# Patient Record
Sex: Male | Born: 1975 | Race: White | Hispanic: No | Marital: Married | Smoking: Current every day smoker
Health system: Southern US, Community
[De-identification: ages and names within clinical notes are randomized; demographics above are authoritative.]

## PROBLEM LIST (undated history)

## (undated) DIAGNOSIS — I10 Essential (primary) hypertension: Secondary | ICD-10-CM

## (undated) DIAGNOSIS — G629 Polyneuropathy, unspecified: Secondary | ICD-10-CM

## (undated) DIAGNOSIS — G4730 Sleep apnea, unspecified: Secondary | ICD-10-CM

## (undated) DIAGNOSIS — K2960 Other gastritis without bleeding: Secondary | ICD-10-CM

## (undated) DIAGNOSIS — R569 Unspecified convulsions: Secondary | ICD-10-CM

## (undated) DIAGNOSIS — M109 Gout, unspecified: Secondary | ICD-10-CM

## (undated) DIAGNOSIS — G43909 Migraine, unspecified, not intractable, without status migrainosus: Secondary | ICD-10-CM

## (undated) DIAGNOSIS — I509 Heart failure, unspecified: Secondary | ICD-10-CM

## (undated) DIAGNOSIS — T39395A Adverse effect of other nonsteroidal anti-inflammatory drugs [NSAID], initial encounter: Secondary | ICD-10-CM

---
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
## 2015-09-23 DIAGNOSIS — K2960 Other gastritis without bleeding: Secondary | ICD-10-CM

## 2015-09-23 HISTORY — DX: Other gastritis without bleeding: K29.60

## 2018-09-25 ENCOUNTER — Encounter (HOSPITAL_COMMUNITY): Payer: Self-pay

## 2018-09-25 ENCOUNTER — Emergency Department (HOSPITAL_COMMUNITY): Payer: Self-pay

## 2018-09-25 ENCOUNTER — Emergency Department (HOSPITAL_COMMUNITY)
Admission: EM | Admit: 2018-09-25 | Discharge: 2018-09-25 | Disposition: A | Discharge status: Home / Self Care | Payer: Self-pay | Attending: Emergency Medicine | Admitting: Emergency Medicine

## 2018-09-25 DIAGNOSIS — M5441 Lumbago with sciatica, right side: Secondary | ICD-10-CM | POA: Insufficient documentation

## 2018-09-25 HISTORY — DX: Essential (primary) hypertension: I10

## 2018-09-25 HISTORY — DX: Gout, unspecified: M10.9

## 2018-09-25 MED ORDER — LIDOCAINE 5 % EX PTCH
1.0000 | MEDICATED_PATCH | CUTANEOUS | Status: DC
  Administered 2018-09-25: 1 via TRANSDERMAL
  Filled 2018-09-25: qty 1

## 2018-09-25 MED ORDER — METHOCARBAMOL 500 MG PO TABS: 500.0000 mg | ORAL_TABLET | Freq: Two times a day (BID) | ORAL | 0 refills | Status: DC

## 2018-09-25 MED ORDER — OXYCODONE-ACETAMINOPHEN 5-325 MG PO TABS
2.0000 | ORAL_TABLET | Freq: Once | ORAL | Status: AC
  Administered 2018-09-25: 2 via ORAL
  Filled 2018-09-25: qty 2

## 2018-09-25 MED ORDER — PREDNISONE 20 MG PO TABS: 60.0000 mg | ORAL_TABLET | Freq: Every day | ORAL | 0 refills | Status: AC

## 2018-09-25 MED ORDER — MORPHINE SULFATE (PF) 4 MG/ML IV SOLN
4.0000 mg | Freq: Once | INTRAVENOUS | Status: AC
  Administered 2018-09-25: 4 mg via INTRAMUSCULAR
  Filled 2018-09-25: qty 1

## 2018-09-25 MED ORDER — PREDNISONE 20 MG PO TABS
60.0000 mg | ORAL_TABLET | Freq: Once | ORAL | Status: AC
  Administered 2018-09-25: 60 mg via ORAL
  Filled 2018-09-25: qty 3

## 2018-09-25 MED ORDER — KETOROLAC TROMETHAMINE 30 MG/ML IJ SOLN
30.0000 mg | Freq: Once | INTRAMUSCULAR | Status: AC
  Administered 2018-09-25: 30 mg via INTRAMUSCULAR
  Filled 2018-09-25: qty 1

## 2018-09-25 MED ORDER — METHOCARBAMOL 500 MG PO TABS
1000.0000 mg | ORAL_TABLET | Freq: Once | ORAL | Status: AC
  Administered 2018-09-25: 1000 mg via ORAL
  Filled 2018-09-25: qty 2

## 2018-09-25 NOTE — ED Triage Notes (Signed)
Patient complains of lower back pain radiating down legs x 1 week. States that he has old injury and thinks flared up, denies recent trauma

## 2018-09-25 NOTE — Discharge Instructions (Signed)
You were seen here today for Back Pain: Low back pain is discomfort in the lower back that may be due to injuries to muscles and ligaments around the spine. Occasionally, it may be caused by a problem to a part of the spine called a disc. Your back pain should be treated with medicines listed below as well as back exercises and this back pain should get better over the next 2 weeks. Most patients get completely well in 4 weeks. It is important to know however, if you develop severe or worsening pain, low back pain with fever, numbness, weakness or inability to walk or urinate, you should return to the ER immediately.  Please follow up with your doctor this week for a recheck if still having symptoms.  HOME INSTRUCTIONS Self - care:  The application of heat can help soothe the pain.  Maintaining your daily activities, including walking (this is encouraged), as it will help you get better faster than just staying in bed. Do not life, push, pull anything more than 10 pounds for the next week. I am attaching back exercises that you can do at home to help facilitate your recovery.   Back Exercises - I have attached a handout on back exercises that can be done at home to help facilitate your recovery.   Medications are also useful to help with pain control.   Acetaminophen.  This medication is generally safe, and found over the counter. Take as directed for your age. You should not take more than 8 of the extra strength (500mg ) pills a day (max dose is 4000mg  total OVER one day)  Non steroidal anti inflammatory: This includes medications including Ibuprofen, naproxen and Mobic; These medications help both pain and swelling and are very useful in treating back pain.  They should be taken with food, as they can cause stomach upset, and more seriously, stomach bleeding. Do not combine the medications.   Muscle relaxants:  These medications can help with muscle tightness that is a cause of lower back pain.  Most  of these medications can cause drowsiness, and it is not safe to drive or use dangerous machinery while taking them. They are primarily helpful when taken at night before sleep.  Prednisone - This is an oral steroid.  This medication is best taken with food in the morning.  Please note that this medication can cause anxiety, mood swings, muscle fatigue, increased hunger, weight gain (sodium/fluid retention), poor sleep as well as other symptoms. If you are a diabetic, please monitor your blood sugars at home as this medication can increase your blood sugars. Call your pharmacist if you have any questions.  You will need to follow up with your primary healthcare provider  or the Orthopedist in 1-2 weeks for reassessment and persistent symptoms.  Be aware that if you develop new symptoms, such as a fever, leg weakness, difficulty with or loss of control of your urine or bowels, abdominal pain, or more severe pain, you will need to seek medical attention and/or return to the Emergency department. Additional Information:  Your vital signs today were: BP (!) 134/95 (BP Location: Left Arm)    Pulse 82    Temp 98.5 F (36.9 C) (Oral)    Resp 18    SpO2 96%  If your blood pressure (BP) was elevated above 135/85 this visit, please have this repeated by your doctor within one month. ---------------

## 2018-09-25 NOTE — ED Provider Notes (Signed)
[facility] Provider Note   CSN: [id number] Arrival date & time: 09/25/18  1234     History   Chief Complaint Chief Complaint  Patient presents with  . Back Pain    HPI Shawn Hines is a 43 y.o. male.  Shawn Mulderslfredo Shawn Hines is a 43 y.o. male with a history of hypertension, gout, neuropathy and chronic low back pain.  He reports ever since a back injury back in 2015 he is intermittently had issues with low back pain.  He reports over the last week he has had severe pain in his lower back which is primarily on the right side of the lower back, pain radiates into his right leg.  He denies any numbness or weakness in the leg.  But reports pain makes it extremely difficult to walk or move.  No loss of bowel or bladder control.  No fevers or chills.  No abdominal pain, nausea or vomiting.  No burning or discomfort with urination.  He has been taking his home oxycodone as well as naproxen for pain, and uses a TENS unit intermittently but reports he has not been able to get relief, he has an appointment with Dr. Mayford KnifeWilliams who manages his chronic low back pain in 3 days but was not able to make it to this appointment due to worsening of pain.     Past Medical History:  Diagnosis Date  . Gout   . Hypertension   . Neuropathy     There are no active problems to display for this patient.   History reviewed. No pertinent surgical history.      Home Medications    Prior to Admission medications   Not on File    Family History No family history on file.  Social History Social History   Tobacco Use  . Smoking status: Not on file  Substance Use Topics  . Alcohol use: Not on file  . Drug use: Not on file     Allergies   Patient has no known allergies.   Review of Systems Review of Systems  Constitutional: Negative for chills and fever.  HENT: Negative.   Respiratory: Negative for shortness of breath.   Cardiovascular: Negative for  chest pain.  Gastrointestinal: Negative for abdominal pain, constipation, diarrhea, nausea and vomiting.  Genitourinary: Negative for dysuria, flank pain, frequency and hematuria.  Musculoskeletal: Positive for back pain. Negative for arthralgias, gait problem, joint swelling, myalgias and neck pain.  Skin: Negative for color change, rash and wound.  Neurological: Negative for weakness and numbness.     Physical Exam Updated Vital Signs BP (!) 134/95 (BP Location: Left Arm)   Pulse 82   Temp 98.5 F (36.9 C) (Oral)   Resp 18   SpO2 96%   Physical Exam Vitals signs and nursing note reviewed.  Constitutional:      General: He is not in acute distress.    Appearance: He is well-developed. He is not diaphoretic.  HENT:     Head: Atraumatic.  Eyes:     General:        Right eye: No discharge.        Left eye: No discharge.  Neck:     Musculoskeletal: Neck supple.  Cardiovascular:     Pulses:          Radial pulses are 2+ on the right side and 2+ on the left side.       Dorsalis pedis pulses are 2+ on the right  side and 2+ on the left side.       Posterior tibial pulses are 2+ on the right side and 2+ on the left side.  Pulmonary:     Effort: Pulmonary effort is normal. No respiratory distress.  Abdominal:     General: Bowel sounds are normal. There is no distension.     Palpations: Abdomen is soft. There is no mass.     Tenderness: There is no abdominal tenderness. There is no guarding.     Comments: Abdomen soft, nondistended, nontender to palpation in all quadrants without guarding or peritoneal signs, no CVA tenderness bilaterally  Musculoskeletal:     Comments: Tenderness to palpation over right lower back.  Pain made worse with range of motion of the lower extremities, positive straight leg raise on the  right.  Skin:    General: Skin is warm and dry.     Capillary Refill: Capillary refill takes less than 2 seconds.  Neurological:     Mental Status: He is alert and  oriented to person, place, and time. Mental status is at baseline.     Comments: Alert, clear speech, following commands. Moving all extremities without difficulty. Bilateral lower extremities with 5/5 strength in proximal and distal muscle groups and with dorsi and plantar flexion. Sensation intact in bilateral lower extremities. 2+ patellar DTRs bilaterally. Ambulatory with steady gait  Psychiatric:        Mood and Affect: Mood normal.        Behavior: Behavior normal.      ED Treatments / Results  Labs (all labs ordered are listed, but only abnormal results are displayed) Labs Reviewed - No data to display  EKG None  Radiology Dg Lumbar Spine Complete  Result Date: 09/25/2018 CLINICAL DATA:  Patient complains of lower back pain radiating down legs x 1 week. States that he has old injury and thinks flared up, denies recent trauma. Pt writhing and hollering in pain. EXAM: LUMBAR SPINE - COMPLETE 4+ VIEW COMPARISON:  None. FINDINGS: No fracture or bone lesion. Slight anterolisthesis of L4 on L5. No other spondylolisthesis. There is straightening of the normal lumbar lordosis. Mild loss of disc height at L4-L5. Remaining discs are well preserved in height. Facet joints are well preserved. Soft tissues are unremarkable. IMPRESSION: 1. No fracture or acute finding. 2. Straightened lumbar lordosis. Mild disc degenerative changes at L4-L5 with a slight anterolisthesis. Electronically Signed   By: Amie Portland M.D.   On: 09/25/2018 15:46    Procedures Procedures (including critical care time)  Medications Ordered in ED Medications  lidocaine (LIDODERM) 5 % 1 patch (1 patch Transdermal Patch Applied 09/25/18 1543)  ketorolac (TORADOL) 30 MG/ML injection 30 mg (30 mg Intramuscular Given 09/25/18 1541)  predniSONE (DELTASONE) tablet 60 mg (60 mg Oral Given 09/25/18 1542)  oxyCODONE-acetaminophen (PERCOCET/ROXICET) 5-325 MG per tablet 2 tablet (2 tablets Oral Given 09/25/18 1542)  methocarbamol  (ROBAXIN) tablet 1,000 mg (1,000 mg Oral Given 09/25/18 1542)  morphine 4 MG/ML injection 4 mg (4 mg Intramuscular Given 09/25/18 1747)     Initial Impression / Assessment and Plan / ED Course  I have reviewed the triage vital signs and the nursing notes.  Pertinent labs & imaging results that were available during my care of the patient were reviewed by me and considered in my medical decision making (see chart for details).  Patient with right lower back pain, history of chronic back pain ever since injury in 2015.  No neurological deficits and normal  neuro exam.  Patient can walk but states is painful.  No loss of bowel or bladder control.  No concern for cauda equina.  No fever, night sweats, weight loss, h/o cancer, IVDU.  X-ray shows no evidence of acute fracture or change, native disc changes at L4-L5.  Pain improved with treatment here in the emergency department.  Patient has follow-up appointment in 3 days with his primary care doctor that has been managing his chronic back pain.  Will discharge home with course of steroids and muscle relaxers in addition to patient's home pain medication.  Return precautions discussed.  Patient expresses understanding and is in agreement with plan.  Final Clinical Impressions(s) / ED Diagnoses   Final diagnoses:  Acute right-sided low back pain with right-sided sciatica    ED Discharge Orders         Ordered    predniSONE (DELTASONE) 20 MG tablet  Daily     09/25/18 1733    methocarbamol (ROBAXIN) 500 MG tablet  2 times daily     09/25/18 1733           Dartha Lodge, [place] 09/25/18 1800    Tilden Fossa, MD 09/27/18 [phone]

## 2018-10-03 ENCOUNTER — Encounter (HOSPITAL_COMMUNITY): Payer: Self-pay

## 2018-10-03 ENCOUNTER — Emergency Department (HOSPITAL_COMMUNITY): Payer: Self-pay

## 2018-10-03 ENCOUNTER — Emergency Department (HOSPITAL_COMMUNITY)
Admission: EM | Admit: 2018-10-03 | Discharge: 2018-10-03 | Disposition: A | Discharge status: Home / Self Care | Payer: Self-pay | Attending: Emergency Medicine | Admitting: Emergency Medicine

## 2018-10-03 DIAGNOSIS — Y939 Activity, unspecified: Secondary | ICD-10-CM | POA: Insufficient documentation

## 2018-10-03 DIAGNOSIS — F17200 Nicotine dependence, unspecified, uncomplicated: Secondary | ICD-10-CM | POA: Insufficient documentation

## 2018-10-03 DIAGNOSIS — M5441 Lumbago with sciatica, right side: Secondary | ICD-10-CM | POA: Insufficient documentation

## 2018-10-03 DIAGNOSIS — Y929 Unspecified place or not applicable: Secondary | ICD-10-CM | POA: Insufficient documentation

## 2018-10-03 DIAGNOSIS — Y33XXXA Other specified events, undetermined intent, initial encounter: Secondary | ICD-10-CM | POA: Insufficient documentation

## 2018-10-03 DIAGNOSIS — S3421XA Injury of nerve root of lumbar spine, initial encounter: Secondary | ICD-10-CM | POA: Insufficient documentation

## 2018-10-03 DIAGNOSIS — M21371 Foot drop, right foot: Secondary | ICD-10-CM | POA: Insufficient documentation

## 2018-10-03 DIAGNOSIS — Y999 Unspecified external cause status: Secondary | ICD-10-CM | POA: Insufficient documentation

## 2018-10-03 DIAGNOSIS — I10 Essential (primary) hypertension: Secondary | ICD-10-CM | POA: Insufficient documentation

## 2018-10-03 LAB — CBC
HCT: 44 % (ref 39.0–52.0)
Hemoglobin: 15 g/dL (ref 13.0–17.0)
MCH: 30.2 pg (ref 26.0–34.0)
MCHC: 34.1 g/dL (ref 30.0–36.0)
MCV: 88.7 fL (ref 80.0–100.0)
Platelets: 344 10*3/uL (ref 150–400)
RBC: 4.96 MIL/uL (ref 4.22–5.81)
RDW: 13.2 % (ref 11.5–15.5)
WBC: 10.7 10*3/uL — ABNORMAL HIGH (ref 4.0–10.5)
nRBC: 0 % (ref 0.0–0.2)

## 2018-10-03 LAB — COMPREHENSIVE METABOLIC PANEL
ALT: 41 U/L (ref 0–44)
AST: 28 U/L (ref 15–41)
Albumin: 4 g/dL (ref 3.5–5.0)
Alkaline Phosphatase: 62 U/L (ref 38–126)
Anion gap: 8 (ref 5–15)
BILIRUBIN TOTAL: 0.6 mg/dL (ref 0.3–1.2)
BUN: 14 mg/dL (ref 6–20)
CO2: 24 mmol/L (ref 22–32)
Calcium: 9 mg/dL (ref 8.9–10.3)
Chloride: 106 mmol/L (ref 98–111)
Creatinine, Ser: 1.02 mg/dL (ref 0.61–1.24)
GFR calc Af Amer: >60 mL/min (ref >60)
GFR calc non Af Amer: >60 mL/min (ref >60)
Glucose, Bld: 101 mg/dL — ABNORMAL HIGH (ref 70–99)
Potassium: 3.6 mmol/L (ref 3.5–5.1)
Sodium: 138 mmol/L (ref 135–145)
Total Protein: 6.6 g/dL (ref 6.5–8.1)

## 2018-10-03 MED ORDER — PREDNISONE 10 MG PO TABS: ORAL_TABLET | ORAL | 0 refills | Status: DC

## 2018-10-03 MED ORDER — HYDROMORPHONE HCL 1 MG/ML IJ SOLN
1.0000 mg | Freq: Once | INTRAMUSCULAR | Status: AC
  Administered 2018-10-03: 1 mg via INTRAVENOUS
  Filled 2018-10-03: qty 1

## 2018-10-03 MED ORDER — DIVALPROEX SODIUM 250 MG PO DR TAB
500.0000 mg | DELAYED_RELEASE_TABLET | Freq: Once | ORAL | Status: AC
  Administered 2018-10-03: 500 mg via ORAL
  Filled 2018-10-03: qty 2

## 2018-10-03 NOTE — Discharge Instructions (Signed)
You were evaluated in the Emergency Department and after careful evaluation, we did not find any emergent condition requiring admission or further testing in the hospital.  Your symptoms today seem to be due to a herniated disc causing compression of a nerve.  This will likely require surgery.  Please call the neurosurgeons office first thing in the morning.  You will likely be able to be seen tomorrow morning at 9 AM.  Take the steroid medication as directed.  Please return to the Emergency Department if you experience any worsening of your condition.  We encourage you to follow up with a primary care provider.  Thank you for allowing [place] to be a part of your care.

## 2018-10-03 NOTE — ED Notes (Signed)
Pt transported to MRI 

## 2018-10-03 NOTE — ED Provider Notes (Signed)
[facility] Provider Note MRN:  [id number]  Arrival date & time: 10/03/18     Chief Complaint   Back Pain   History of Present Illness   Shawn Hines is a 43 y.o. year-old male with a history of chronic back pain presenting to the ED with chief complaint of back pain.  Patient explains that he has had chronic pain in the lower back related to ruptured disks that occurred back in 2015, has had multiple MRIs with the last one being in 2016.  For the past 2 weeks, he has been having increased pain to the midline and right-sided lumbar back.  Woke up with increased pain, has been progressively worsening.  Was seen in this emergency department a few days ago, given prednisone prescription, no improvement.  Pain is now 10 out of 10, constant, worse with motion or palpation.  Patient endorsing new numbness to the right leg up to the level of the knee as well as weakness to the left leg, noticing that he has to swing the leg out in order to walk.  Denies bowel or bladder dysfunction.  Denies fever, no IV drug use, no hematuria or dysuria, no chest pain or shortness of breath, no abdominal pain.  Review of Systems  A complete 10 system review of systems was obtained and all systems are negative except as noted in the HPI and PMH.   Patient's Health History    Past Medical History:  Diagnosis Date  . Gout   . Hypertension   . Neuropathy     No past surgical history on file.  No family history on file.  Social History   Socioeconomic History  . Marital status: Single    Spouse name: Not on file  . Number of children: Not on file  . Years of education: Not on file  . Highest education level: Not on file  Occupational History  . Not on file  Social Needs  . Financial resource strain: Not on file  . Food insecurity:    Worry: Not on file    Inability: Not on file  . Transportation needs:    Medical: Not on file    Non-medical: Not on file    Tobacco Use  . Smoking status: Current Every Day Smoker  . Smokeless tobacco: Never Used  Substance and Sexual Activity  . Alcohol use: Never    Frequency: Never  . Drug use: Never  . Sexual activity: Not on file  Lifestyle  . Physical activity:    Days per week: Not on file    Minutes per session: Not on file  . Stress: Not on file  Relationships  . Social connections:    Talks on phone: Not on file    Gets together: Not on file    Attends religious service: Not on file    Active member of club or organization: Not on file    Attends meetings of clubs or organizations: Not on file    Relationship status: Not on file  . Intimate partner violence:    Fear of current or ex partner: Not on file    Emotionally abused: Not on file    Physically abused: Not on file    Forced sexual activity: Not on file  Other Topics Concern  . Not on file  Social History Narrative  . Not on file     Physical Exam  Vital Signs and Nursing Notes reviewed Vitals:   10/03/18  1601 10/03/18 1717  BP: (!) 152/96 (!) 150/91  Pulse: 90 84  Resp: 18   Temp:    SpO2: 98% 96%    CONSTITUTIONAL: Well-appearing, NAD NEURO:  Alert and oriented x 3, decreased sensation to the right leg, absent right patellar reflex, right foot drop during ambulation EYES:  eyes equal and reactive ENT/NECK:  no LAD, no JVD CARDIO: Regular rate, well-perfused, normal S1 and S2 PULM:  CTAB no wheezing or rhonchi GI/GU:  normal bowel sounds, non-distended, non-tender MSK/SPINE:  No gross deformities, no edema SKIN:  no rash, atraumatic PSYCH:  Appropriate speech and behavior  Diagnostic and Interventional Summary    Labs Reviewed  CBC - Abnormal; Notable for the following components:      Result Value   WBC 10.7 (*)    All other components within normal limits  COMPREHENSIVE METABOLIC PANEL - Abnormal; Notable for the following components:   Glucose, Bld 101 (*)    All other components within normal limits     MR LUMBAR SPINE WO CONTRAST  Final Result      Medications  HYDROmorphone (DILAUDID) injection 1 mg (1 mg Intravenous Given 10/03/18 1430)  divalproex (DEPAKOTE) DR tablet 500 mg (500 mg Oral Given 10/03/18 1600)  HYDROmorphone (DILAUDID) injection 1 mg (1 mg Intravenous Given 10/03/18 1715)     Procedures Critical Care  ED Course and Medical Decision Making  I have reviewed the triage vital signs and the nursing notes.  Pertinent labs & imaging results that were available during my care of the patient were reviewed by me and considered in my medical decision making (see below for details).  Progressive neurological deficit in this 43 year old male with acute on chronic low back pain.  Has known L5-S1 issues in the past.  Will need MRI to exclude myelopathy.  MRI reveals L5 nerve root compression.  Upon reevaluation, patient walks with a foot drop.  Findings discussed with neurosurgeon on-call, PA Meyran.  Per recommendations, will provide steroid taper, patient will call the office first thing tomorrow morning to be seen tomorrow morning likely to book surgery for this week.  After the discussed management above, the patient was determined to be safe for discharge.  The patient was in agreement with this plan and all questions regarding their care were answered.  ED return precautions were discussed and the patient will return to the ED with any significant worsening of condition.  Elmer SowMichael M. Pilar PlateBero, MD [facility] [email or web address]  Final Clinical Impressions(s) / ED Diagnoses     ICD-10-CM   1. Acute right-sided low back pain with right-sided sciatica M54.41   2. Injury of spinal nerve root at L5 level, initial encounter S34.21XA   3. Right foot drop M21.371     ED Discharge Orders         Ordered    predniSONE (DELTASONE) 10 MG tablet     10/03/18 1730             Sabas SousBero, Boyde Grieco M, MD 10/03/18 1732

## 2018-10-03 NOTE — ED Triage Notes (Signed)
Pt c/o back pain, was seen here for same 8 days ago.

## 2018-10-04 ENCOUNTER — Inpatient Hospital Stay (HOSPITAL_COMMUNITY)
Admission: EM | Admit: 2018-10-04 | Discharge: 2018-10-11 | DRG: 520 | Disposition: A | Discharge status: Home Health | Payer: Self-pay | Attending: Oncology | Admitting: Oncology

## 2018-10-04 ENCOUNTER — Encounter (HOSPITAL_COMMUNITY): Payer: Self-pay | Admitting: *Deleted

## 2018-10-04 DIAGNOSIS — M62838 Other muscle spasm: Secondary | ICD-10-CM | POA: Diagnosis not present

## 2018-10-04 DIAGNOSIS — M48061 Spinal stenosis, lumbar region without neurogenic claudication: Secondary | ICD-10-CM | POA: Diagnosis present

## 2018-10-04 DIAGNOSIS — M21371 Foot drop, right foot: Secondary | ICD-10-CM | POA: Diagnosis present

## 2018-10-04 DIAGNOSIS — M549 Dorsalgia, unspecified: Secondary | ICD-10-CM

## 2018-10-04 DIAGNOSIS — Z79891 Long term (current) use of opiate analgesic: Secondary | ICD-10-CM

## 2018-10-04 DIAGNOSIS — Z79899 Other long term (current) drug therapy: Secondary | ICD-10-CM

## 2018-10-04 DIAGNOSIS — F17210 Nicotine dependence, cigarettes, uncomplicated: Secondary | ICD-10-CM

## 2018-10-04 DIAGNOSIS — G8921 Chronic pain due to trauma: Secondary | ICD-10-CM

## 2018-10-04 DIAGNOSIS — M109 Gout, unspecified: Secondary | ICD-10-CM | POA: Diagnosis present

## 2018-10-04 DIAGNOSIS — Z419 Encounter for procedure for purposes other than remedying health state, unspecified: Secondary | ICD-10-CM

## 2018-10-04 DIAGNOSIS — M5116 Intervertebral disc disorders with radiculopathy, lumbar region: Principal | ICD-10-CM

## 2018-10-04 DIAGNOSIS — G8929 Other chronic pain: Secondary | ICD-10-CM | POA: Diagnosis present

## 2018-10-04 DIAGNOSIS — G629 Polyneuropathy, unspecified: Secondary | ICD-10-CM | POA: Diagnosis present

## 2018-10-04 DIAGNOSIS — G43909 Migraine, unspecified, not intractable, without status migrainosus: Secondary | ICD-10-CM

## 2018-10-04 DIAGNOSIS — M5416 Radiculopathy, lumbar region: Secondary | ICD-10-CM

## 2018-10-04 DIAGNOSIS — M5126 Other intervertebral disc displacement, lumbar region: Secondary | ICD-10-CM

## 2018-10-04 DIAGNOSIS — Z9889 Other specified postprocedural states: Secondary | ICD-10-CM

## 2018-10-04 DIAGNOSIS — I10 Essential (primary) hypertension: Secondary | ICD-10-CM

## 2018-10-04 DIAGNOSIS — Z23 Encounter for immunization: Secondary | ICD-10-CM

## 2018-10-04 HISTORY — DX: Migraine, unspecified, not intractable, without status migrainosus: G43.909

## 2018-10-04 HISTORY — DX: Other gastritis without bleeding: K29.60

## 2018-10-04 HISTORY — DX: Adverse effect of other nonsteroidal anti-inflammatory drugs (NSAID), initial encounter: T39.395A

## 2018-10-04 HISTORY — DX: Polyneuropathy, unspecified: G62.9

## 2018-10-04 HISTORY — DX: Sleep apnea, unspecified: G47.30

## 2018-10-04 HISTORY — DX: Heart failure, unspecified: I50.9

## 2018-10-04 HISTORY — DX: Unspecified convulsions: R56.9

## 2018-10-04 LAB — BASIC METABOLIC PANEL
Anion gap: 10 (ref 5–15)
BUN: 11 mg/dL (ref 6–20)
CO2: 23 mmol/L (ref 22–32)
Calcium: 9.4 mg/dL (ref 8.9–10.3)
Chloride: 108 mmol/L (ref 98–111)
Creatinine, Ser: 0.89 mg/dL (ref 0.61–1.24)
GFR calc Af Amer: >60 mL/min (ref >60)
GFR calc non Af Amer: >60 mL/min (ref >60)
Glucose, Bld: 99 mg/dL (ref 70–99)
POTASSIUM: 4 mmol/L (ref 3.5–5.1)
Sodium: 141 mmol/L (ref 135–145)

## 2018-10-04 LAB — CBC WITH DIFFERENTIAL/PLATELET
Abs Immature Granulocytes: 0.1 10*3/uL — ABNORMAL HIGH (ref 0.00–0.07)
BASOS PCT: 1 %
Basophils Absolute: 0.1 10*3/uL (ref 0.0–0.1)
EOS ABS: 0.3 10*3/uL (ref 0.0–0.5)
Eosinophils Relative: 2 %
HCT: 43.9 % (ref 39.0–52.0)
Hemoglobin: 14.7 g/dL (ref 13.0–17.0)
Immature Granulocytes: 1 %
Lymphocytes Relative: 19 %
Lymphs Abs: 2.2 10*3/uL (ref 0.7–4.0)
MCH: 29.2 pg (ref 26.0–34.0)
MCHC: 33.5 g/dL (ref 30.0–36.0)
MCV: 87.1 fL (ref 80.0–100.0)
Monocytes Absolute: 0.6 10*3/uL (ref 0.1–1.0)
Monocytes Relative: 5 %
Neutro Abs: 8 10*3/uL — ABNORMAL HIGH (ref 1.7–7.7)
Neutrophils Relative %: 72 %
PLATELETS: 339 10*3/uL (ref 150–400)
RBC: 5.04 MIL/uL (ref 4.22–5.81)
RDW: 12.7 % (ref 11.5–15.5)
WBC: 11.2 10*3/uL — ABNORMAL HIGH (ref 4.0–10.5)
nRBC: 0 % (ref 0.0–0.2)

## 2018-10-04 MED ORDER — CHLORTHALIDONE 25 MG PO TABS
25.0000 mg | ORAL_TABLET | Freq: Every day | ORAL | Status: DC
  Administered 2018-10-05 – 2018-10-11 (×6): 25 mg via ORAL
  Filled 2018-10-04 (×8): qty 1

## 2018-10-04 MED ORDER — HYDROMORPHONE HCL 1 MG/ML IJ SOLN
2.0000 mg | INTRAMUSCULAR | Status: DC
  Administered 2018-10-04 – 2018-10-07 (×15): 2 mg via INTRAVENOUS
  Filled 2018-10-04 (×16): qty 2

## 2018-10-04 MED ORDER — ACETAMINOPHEN 325 MG PO TABS: 650.0000 mg | ORAL_TABLET | Freq: Four times a day (QID) | ORAL | Status: DC | PRN

## 2018-10-04 MED ORDER — ATENOLOL 25 MG PO TABS
50.0000 mg | ORAL_TABLET | Freq: Every day | ORAL | Status: DC
  Administered 2018-10-04 – 2018-10-11 (×8): 50 mg via ORAL
  Filled 2018-10-04 (×9): qty 2

## 2018-10-04 MED ORDER — GABAPENTIN 400 MG PO CAPS
400.0000 mg | ORAL_CAPSULE | Freq: Three times a day (TID) | ORAL | Status: DC
  Administered 2018-10-04 – 2018-10-11 (×19): 400 mg via ORAL
  Filled 2018-10-04 (×20): qty 1

## 2018-10-04 MED ORDER — ATENOLOL-CHLORTHALIDONE 50-25 MG PO TABS: 1.0000 | ORAL_TABLET | Freq: Every day | ORAL | Status: DC

## 2018-10-04 MED ORDER — AMLODIPINE BESYLATE 10 MG PO TABS
10.0000 mg | ORAL_TABLET | Freq: Every day | ORAL | Status: DC
  Administered 2018-10-04 – 2018-10-11 (×7): 10 mg via ORAL
  Filled 2018-10-04 (×8): qty 1

## 2018-10-04 MED ORDER — SODIUM CHLORIDE 0.9% FLUSH
3.0000 mL | Freq: Two times a day (BID) | INTRAVENOUS | Status: DC
  Administered 2018-10-04 – 2018-10-06 (×5): 3 mL via INTRAVENOUS

## 2018-10-04 MED ORDER — HYDROMORPHONE HCL 1 MG/ML IJ SOLN
1.0000 mg | Freq: Once | INTRAMUSCULAR | Status: AC
  Administered 2018-10-04: 1 mg via INTRAVENOUS
  Filled 2018-10-04: qty 1

## 2018-10-04 MED ORDER — POLYETHYLENE GLYCOL 3350 17 G PO PACK: 17.0000 g | PACK | Freq: Every day | ORAL | Status: DC | PRN

## 2018-10-04 MED ORDER — SENNOSIDES-DOCUSATE SODIUM 8.6-50 MG PO TABS
2.0000 | ORAL_TABLET | Freq: Two times a day (BID) | ORAL | Status: DC
  Administered 2018-10-04 – 2018-10-11 (×13): 2 via ORAL
  Filled 2018-10-04 (×14): qty 2

## 2018-10-04 MED ORDER — OXYCODONE HCL 5 MG PO TABS
15.0000 mg | ORAL_TABLET | ORAL | Status: DC
  Administered 2018-10-04 – 2018-10-11 (×38): 15 mg via ORAL
  Filled 2018-10-04 (×39): qty 3

## 2018-10-04 MED ORDER — METHOCARBAMOL 500 MG PO TABS: 500.0000 mg | ORAL_TABLET | Freq: Two times a day (BID) | ORAL | Status: DC

## 2018-10-04 MED ORDER — ACETAMINOPHEN 650 MG RE SUPP: 650.0000 mg | Freq: Four times a day (QID) | RECTAL | Status: DC | PRN

## 2018-10-04 NOTE — ED Notes (Signed)
Patient ambulated to restroom with assistance from this RN.

## 2018-10-04 NOTE — ED Notes (Signed)
Teirra-(SR)Dinner Tray Ordered @ 1717-per RN-called by [place]  

## 2018-10-04 NOTE — H&P (Signed)
Date: 10/04/2018               Patient Name:  Shawn Hines MRN: [id number]  DOB: 12-22-1975 Age / Sex: 43 y.o., male   PCP: Jearld LeschWilliams, Shawn M, MD         Medical Service: Internal Medicine Teaching Service         Attending Physician: Dr. Gust RungHoffman, Shawn C, DO    First Contact: Dr. Chesley MiresBlo[place]field Pager: [phone]  Second Contact: Dr. Evelene CroonSantos  Pager: [phone]       After Hours (After 5p/  First Contact Pager: [phone]  weekends / holidays): Second Contact Pager: [phone]   Chief Complaint: back pain   History of Present Illness: Mr. Shawn Parcelalacios is a 43 y/o gentleman with PMHx HTN and chronic low back pain since a lifting injury in 2015 who presents for acute worsening of low back pain that began on 12/27. He recalls putting in a mailbox 3 days prior to symptom onset, but no other heavy lifting or acute injury that he can recall. He is on Oxy 15 mg QID, Gabapentin 400 mg TID, and Robaxin 500 mg BID for chronic back pain which typically keeps his pain around a 6. However, his pain has been a constant 10/10 for the last 2 weeks. He describes it as sharp, stabbing in nature and radiates into his right gluteal region and down his leg with associated numbness/tingling from his knee down and leg weakness. No symptoms in his left leg. He was referred to neurosurgery after ED visit yesterday for similar symptoms. He was told his symptoms were not consistent with his imaging findings, and they wanted to proceed with EMG studies. Given that his pain was uncontrolled on oral medications, they recommended that he return to the hospital to be admitted for pain control and further work-up.  He denies fevers, chills, chest pain, shortness of breath, abdominal pain, bowel or bladder incontinence.    Meds: Amlodipine 10 mg              Atenolol-chlorthalidone 50-25 mg              Depakote 500 mg PRN headache             Neurontin 400 mg TID             Indomethacin prn Gout             Oxy 15 mg QID        Robaxin 500 mg BID    Allergies: Allergies as of 10/04/2018  . (No Known Allergies)   Past Medical History:  Diagnosis Date  . Gout   . Hypertension   . Neuropathy     Family History: heart disease, DM, renal failure   Social History: smokes 1/2 ppd; no EtOH or illicit drug use  Review of Systems: A complete ROS was negative except as per HPI.   Physical Exam: Blood pressure (!) 160/74, pulse 81, temperature 98.4 F (36.9 C), temperature source Oral, resp. rate 18, SpO2 97 %. General: awake, alert, lying in bed, appears uncomfortable  HEENT: [place]/AT. Conjunctiva clear. Poor dentition Neck: supple; no thyromegaly CV: RRR; no murmurs, rubs or gallops Pulm: normal respiratory effort; lungs CTA bilaterally Abd: BS+; abdomen is soft, non-tender, non-distended Neuro: A&Ox3; follows commands and answers questions appropriately; LLE with 5/5 strength, intact sensation and 2+ patellar and achilles reflexes; RLE TTP along quadriceps muscles; decreased sensation below knee; strength is 1-2+/5 with plantar and dorsiflexion, 0/5  with hip flexion, absent patellar reflex, 2+ achilles reflex  Psych: appropriate mood and affect  Skin: warm and dry; no skin breakdown or rashes   Assessment & Plan by Problem: Principal Problem:   Lumbar herniated disc Active Problems:   Hypertension   Chronic back pain   Long term current use of opiate analgesic  1. Acute on chronic low back pain - MRI with herniated disc and foraminal narrowing that likely involve right L4 and L5 nerve roots which is consistent with physical exam findings  - neurosurgery has been consulted and will proceed with EMG studies while inpatient prior to any acute intervention - no concern for infection given normal white count and afebrile  - for pain control, will continue home oxycodone in addition to scheduled 2 mg Dilaudid q 4 hrs; if remains poorly controlled, may need PCA pump - scheduled bowel regimen   2. HTN -  patient has been hypertensive likely in the setting of pain - will continue home Amlodipine 10 mg and atenolol-chlorthalidone 50-25 mg daily  3. Migraines - patient takes Depakote 500 mg prn  - denies current headache; will add if needed   Diet: heart healthy; NPO at midnight DVT ppx: SCDs CODE: FULL   Dispo: Admit patient to Inpatient with expected length of stay greater than 2 midnights.  SignedBridget Hartshorn: Preesha Benjamin D, DO 10/04/2018, 3:55 PM  Pager: [phone]

## 2018-10-04 NOTE — ED Triage Notes (Signed)
Pt in c/o continued back pain, seen for same recently, no changes

## 2018-10-04 NOTE — Progress Notes (Signed)
NEUROSURGERY PROGRESS NOTE  Patient was seen in the office today by myself and Dr. Yetta Barre after being consulted last night about his acute disc herniation and unrelenting back pain with radiculopathy in his right leg. Patient exam was not consistent with MRI findings. On exam he has 2/5 hip flexion, 3/5 knee extension and flexion and 3/5 EHL. The disc protrusion on the right at L4-5 would explain his right foot drop but with all of his other weakness we need to rule out other etiologies of his symptoms like femoral neuropathy. It was discussed with the patient that we need further workup with EMGs but the patient was not thrilled about this plan of care as he was in excrutiating pain. He is already taking 15mg   Oxycodone and gabapentin, therefore we suggested he be admitted to the hospital for pain control with IV pain medication and steroids. I do believe that he will likely need a microdiskectomy but we need to rule out other etiologies first before moving forward.   Temp:  [98.4 F (36.9 C)] 98.4 F (36.9 C) (01/13 1217) Pulse Rate:  [81-91] 91 (01/13 1745) Resp:  [16-18] 18 (01/13 1745) BP: (147-166)/(74-108) 147/89 (01/13 1749) SpO2:  [92 %-97 %] 94 % (01/13 1745)  Sherryl Manges, NP 10/04/2018 6:45 PM

## 2018-10-04 NOTE — ED Notes (Signed)
Pt staztes he was seen here yesterday and sent to see neurosurgeon today , neuro wanted him to have ENG, a testt that he cannot afford he states , spt states he was told that he was in that much pain he was to come to the ER and be admitted for pain control

## 2018-10-04 NOTE — ED Provider Notes (Signed)
[facility] EMERGENCY DEPARTMENT Provider Note   CSN: [id number] Arrival date & time: 10/04/18  1213     History   Chief Complaint Chief Complaint  Patient presents with  . Back Pain    HPI Shawn Hines is a 43 y.o. male.  Planes of low back pain radiating to the right leg for several weeks.  Progressively worsening.  Associated symptoms include difficulty walking and flexing at the right hip.  He was seen at neurosurgeons office this morning sent here for pain control.  No loss of bladder or bowel control.  No other associated symptoms.  Pain is worse with movement.  Not improved with anything.  He has been treated with oxycodone 15 mg, without adequate relief. no recent injury.  No fever.  No other associated symptoms.  Patient had an MRI scan of lumbar spine yesterday showing right paramedian disc protrusion with mild subarticular narrowing that could impact the traversing right L5 nerve root mild right foraminal narrowing at L4-5 which could impact right L4 nerve root and mild disc bulging at L5-S1 without significant stenosis.  HPI  Past Medical History:  Diagnosis Date  . Gout   . Hypertension   . Neuropathy     There are no active problems to display for this patient.   History reviewed. No pertinent surgical history.      Home Medications    Prior to Admission medications   Medication Sig Start Date End Date Taking? Authorizing Provider  amLODipine (NORVASC) 10 MG tablet Take 10 mg by mouth daily.    [provider]  atenolol-chlorthalidone (TENORETIC) 50-25 MG tablet Take 1 tablet by mouth daily.    [provider]  divalproex (DEPAKOTE) 500 MG DR tablet Take 500 mg by mouth 2 (two) times daily.    [provider]  gabapentin (NEURONTIN) 400 MG capsule Take 400 mg by mouth 3 (three) times daily.    [provider]  INDOMETHACIN PO Take 1 capsule by mouth daily as needed (gout).    [provider]    methocarbamol (ROBAXIN) 500 MG tablet Take 1 tablet (500 mg total) by mouth 2 (two) times daily. 09/25/18   Dartha Lodge, PA-C  oxyCODONE (ROXICODONE) 15 MG immediate release tablet Take 15 mg by mouth 4 (four) times daily.    [provider]  predniSONE (DELTASONE) 10 MG tablet Take 4 tablets once a day for 3 days. Take 3 tablets once a day for the next 3 days. Take 2 tablets once a day for the next 3 days. Take 1 tablet once a day for the next 3 days. 10/03/18   Sabas Sous, MD    Family History History reviewed. No pertinent family history.  Social History Social History   Tobacco Use  . Smoking status: Current Every Day Smoker  . Smokeless tobacco: Never Used  Substance Use Topics  . Alcohol use: Never    Frequency: Never  . Drug use: Never     Allergies   Patient has no known allergies.   Review of Systems Review of Systems  Constitutional: Negative.   HENT: Negative.   Respiratory: Negative.   Cardiovascular: Positive for chest pain.       Syncope  Gastrointestinal: Negative.   Musculoskeletal: Positive for back pain.  Skin: Negative.   Allergic/Immunologic: Negative.   Neurological: Positive for weakness.  Psychiatric/Behavioral: Negative.   All other systems reviewed and are negative.    Physical Exam Updated Vital Signs BP Marland Kitchen)  166/95 (BP Location: Left Arm)   Pulse 91   Temp 98.4 F (36.9 C) (Oral)   Resp 16   SpO2 92%   Physical Exam Vitals signs and nursing note reviewed.  Constitutional:      Appearance: He is well-developed.     Comments: Appears uncomfortable  HENT:     Head: Normocephalic and atraumatic.  Eyes:     Conjunctiva/sclera: Conjunctivae normal.     Pupils: Pupils are equal, round, and reactive to light.  Neck:     Musculoskeletal: Neck supple.     Thyroid: No thyromegaly.     Trachea: No tracheal deviation.  Cardiovascular:     Rate and Rhythm: Normal rate and regular rhythm.     Heart sounds: No murmur.   Pulmonary:     Effort: Pulmonary effort is normal.     Breath sounds: Normal breath sounds.  Abdominal:     General: Bowel sounds are normal. There is no distension.     Palpations: Abdomen is soft.     Tenderness: There is no abdominal tenderness.  Musculoskeletal: Normal range of motion.        General: No tenderness.     Comments: Entire  spine is nontender.  He has pain at lumbar area when he stands up from a seated position.  Walks with limp favoring left leg  Skin:    General: Skin is warm and dry.     Findings: No rash.  Neurological:     Mental Status: He is alert and oriented to person, place, and time.     Coordination: Coordination normal.     Comments: Walks with limp favoring left leg      ED Treatments / Results  Labs (all labs ordered are listed, but only abnormal results are displayed) Labs Reviewed  BASIC METABOLIC PANEL  CBC WITH DIFFERENTIAL/PLATELET    EKG None  Radiology Mr Lumbar Spine Wo Contrast  Result Date: 10/03/2018 CLINICAL DATA:  Neck pain. Rack believe progressive right lower extremity radiculopathy. Right lower leg numbness and weakness developing over the last 16 days. EXAM: MRI LUMBAR SPINE WITHOUT CONTRAST TECHNIQUE: Multiplanar, multisequence MR imaging of the lumbar spine was performed. No intravenous contrast was administered. COMPARISON:  Lumbar spine radiographs 09/25/2018 FINDINGS: Segmentation: 5 non rib-bearing lumbar type vertebral bodies are present. The lowest fully formed vertebral body is L5. Alignment:  AP alignment is anatomic. Vertebrae:  Marrow signal and vertebral body heights are normal. Conus medullaris and cauda equina: Conus extends to the L1-2 level. Conus and cauda equina appear normal. Paraspinal and other soft tissues: Limited imaging the abdomen is unremarkable. There is no significant adenopathy. No solid organ lesions are present. Disc levels: L1-2: Normal L2-3: Normal. L3-4: Normal L4-5: A right paramedian disc  protrusion is present. There is mild right subarticular narrowing. The disc extends into the right foramen with mild right foraminal narrowing as well. The left foramen is patent. L5-S1: Mild disc bulging is present without significant stenosis. IMPRESSION: 1. Right paramedian disc protrusion with mild right subarticular narrowing could impact the traversing right L5 nerve root. 2. Mild right foraminal narrowing at L4-5 could also impact the right L4 nerve root. 3. Mild disc bulging at L5-S1 without significant stenosis. Electronically Signed   By: Marin Robertshristopher  Mattern M.D.   On: 10/03/2018 15:19    Procedures Procedures (including critical care time)  Medications Ordered in ED Medications  HYDROmorphone (DILAUDID) injection 1 mg (has no administration in time range)   [facility]  [facility].  Will require EMG studies.  They are requesting admission to medical service and neurosurgery will consult.  2:10 PM pain not improved after treatment with intravenous hydromorphone.  Additional IV hydromorphone ordered Results for orders placed or performed during the hospital encounter of 10/04/18  Basic metabolic panel  Result Value Ref Range   Sodium 141 135 - 145 mmol/L   Potassium 4.0 3.5 - 5.1 mmol/L   Chloride 108 98 - 111 mmol/L   CO2 23 22 - 32 mmol/L   Glucose, Bld 99 70 - 99 mg/dL   BUN 11 6 - 20 mg/dL   Creatinine, Ser 1.610.89 0.61 - 1.24 mg/dL   Calcium 9.4 8.9 - 09.610.3 mg/dL   GFR calc non Af Amer >60 >60 mL/min   GFR calc Af Amer >60 >60 mL/min   Anion gap 10 5 - 15  CBC with Differential/Platelet  Result Value Ref Range   WBC 11.2 (H) 4.0 - 10.5 K/uL   RBC 5.04 4.22 - 5.81 MIL/uL   Hemoglobin 14.7 13.0 - 17.0 g/dL   HCT 04.543.9 40.939.0 - 81.152.0 %   MCV 87.1 80.0 - 100.0 fL   MCH 29.2 26.0 - 34.0 pg   MCHC 33.5 30.0 - 36.0 g/dL   RDW 91.412.7 78.211.5 - 95.615.5 %   Platelets 339 150 - 400 K/uL   nRBC 0.0 0.0 - 0.2 %   Neutrophils Relative % 72 %   Neutro Abs  8.0 (H) 1.7 - 7.7 K/uL   Lymphocytes Relative 19 %   Lymphs Abs 2.2 0.7 - 4.0 K/uL   Monocytes Relative 5 %   Monocytes Absolute 0.6 0.1 - 1.0 K/uL   Eosinophils Relative 2 %   Eosinophils Absolute 0.3 0.0 - 0.5 K/uL   Basophils Relative 1 %   Basophils Absolute 0.1 0.0 - 0.1 K/uL   Immature Granulocytes 1 %   Abs Immature Granulocytes 0.10 (H) 0.00 - 0.07 K/uL   Dg Lumbar Spine Complete  Result Date: 09/25/2018 CLINICAL DATA:  Patient complains of lower back pain radiating down legs x 1 week. States that he has old injury and thinks flared up, denies recent trauma. Pt writhing and hollering in pain. EXAM: LUMBAR SPINE - COMPLETE 4+ VIEW COMPARISON:  None. FINDINGS: No fracture or bone lesion. Slight anterolisthesis of L4 on L5. No other spondylolisthesis. There is straightening of the normal lumbar lordosis. Mild loss of disc height at L4-L5. Remaining discs are well preserved in height. Facet joints are well preserved. Soft tissues are unremarkable. IMPRESSION: 1. No fracture or acute finding. 2. Straightened lumbar lordosis. Mild disc degenerative changes at L4-L5 with a slight anterolisthesis. Electronically Signed   By: Amie Portlandavid  Ormond M.D.   On: 09/25/2018 15:46   Mr Lumbar Spine Wo Contrast  Result Date: 10/03/2018 CLINICAL DATA:  Neck pain. Rack believe progressive right lower extremity radiculopathy. Right lower leg numbness and weakness developing over the last 16 days. EXAM: MRI LUMBAR SPINE WITHOUT CONTRAST TECHNIQUE: Multiplanar, multisequence MR imaging of the lumbar spine was performed. No intravenous contrast was administered. COMPARISON:  Lumbar spine radiographs 09/25/2018 FINDINGS: Segmentation: 5 non rib-bearing lumbar type vertebral bodies are present. The lowest fully formed vertebral body is L5. Alignment:  AP alignment is anatomic. Vertebrae:  Marrow signal and vertebral body heights are normal. Conus medullaris and cauda equina: Conus extends to the L1-2 level. Conus and cauda  equina appear normal. Paraspinal and other soft tissues: Limited imaging the abdomen is unremarkable.  There is no significant adenopathy. No solid organ lesions are present. Disc levels: L1-2: Normal L2-3: Normal. L3-4: Normal L4-5: A right paramedian disc protrusion is present. There is mild right subarticular narrowing. The disc extends into the right foramen with mild right foraminal narrowing as well. The left foramen is patent. L5-S1: Mild disc bulging is present without significant stenosis. IMPRESSION: 1. Right paramedian disc protrusion with mild right subarticular narrowing could impact the traversing right L5 nerve root. 2. Mild right foraminal narrowing at L4-5 could also impact the right L4 nerve root. 3. Mild disc bulging at L5-S1 without significant stenosis. Electronically Signed   By: Marin Roberts M.D.   On: 10/03/2018 15:19   Initial Impression / Assessment and Plan / ED Course  I have reviewed the triage vital signs and the nursing notes.  Pertinent labs & imaging results that were available during my care of the patient were reviewed by me and considered in my medical decision making (see chart for details).   Lab work consistent with mild leukocytosis otherwise normal  I consulted Dr. Evelene Croon from internal medicine service who will arrange for overnight stay  Final Clinical Impressions(s) / ED Diagnoses  Diagnosis lumbar radiculopathy-right sided Final diagnoses:  None    ED Discharge Orders    None       Doug Sou, MD 10/04/18 1444

## 2018-10-04 NOTE — ED Notes (Signed)
Admitting rounding team bedside.  

## 2018-10-04 NOTE — ED Notes (Signed)
ED Provider at bedside. 

## 2018-10-05 ENCOUNTER — Other Ambulatory Visit: Payer: Self-pay

## 2018-10-05 ENCOUNTER — Encounter (HOSPITAL_COMMUNITY): Payer: Self-pay | Admitting: General Practice

## 2018-10-05 DIAGNOSIS — M545 Low back pain: Secondary | ICD-10-CM

## 2018-10-05 DIAGNOSIS — M62838 Other muscle spasm: Secondary | ICD-10-CM

## 2018-10-05 DIAGNOSIS — G8929 Other chronic pain: Secondary | ICD-10-CM

## 2018-10-05 LAB — HIV ANTIBODY (ROUTINE TESTING W REFLEX): HIV Screen 4th Generation wRfx: NONREACTIVE

## 2018-10-05 LAB — MRSA PCR SCREENING: MRSA by PCR: NEGATIVE

## 2018-10-05 MED ORDER — METHOCARBAMOL 500 MG PO TABS
1000.0000 mg | ORAL_TABLET | Freq: Four times a day (QID) | ORAL | Status: DC
  Administered 2018-10-05 – 2018-10-06 (×7): 1000 mg via ORAL
  Filled 2018-10-05 (×8): qty 2

## 2018-10-05 MED ORDER — NALOXONE HCL 0.4 MG/ML IJ SOLN: 0.4000 mg | INTRAMUSCULAR | Status: DC | PRN

## 2018-10-05 MED ORDER — PNEUMOCOCCAL VAC POLYVALENT 25 MCG/0.5ML IJ INJ
0.5000 mL | INJECTION | INTRAMUSCULAR | Status: AC
  Administered 2018-10-11: 0.5 mL via INTRAMUSCULAR
  Filled 2018-10-05: qty 0.5

## 2018-10-05 MED ORDER — DEXAMETHASONE SODIUM PHOSPHATE 10 MG/ML IJ SOLN
10.0000 mg | Freq: Once | INTRAMUSCULAR | Status: AC
  Administered 2018-10-05: 10 mg via INTRAVENOUS
  Filled 2018-10-05: qty 1

## 2018-10-05 NOTE — Evaluation (Signed)
Physical Therapy Evaluation Patient Details Name: Shawn Hines MRN: [id number] DOB: 03-11-1976 Today's Date: 10/05/2018   History of Present Illness  43 y.o. male admitted on 10/04/18 for acute on chronic low back pain.  Pt with significant PMH of HTN, migraines, neuropathy, and gout.  Pt self reports seizure as well.    Clinical Impression  Pt demonstrates significant right leg weakness and lower leg numbness (in a sock-like pattern).  He has a steppage gait pattern over an unstable knee.  RW has helped with his ability to safely mobilize around his hospital room vs at home he was holding to furniture and friends when available.  He would benefit from post acute therapy if he qualifies and a RW.   PT to follow acutely for deficits listed below.      Follow Up Recommendations Home health PT    Equipment Recommendations  Rolling walker with 5" wheels    Recommendations for Other Services   NA    Precautions / Restrictions Precautions Precautions: Back Precaution Comments: for comfort Required Braces or Orthoses: (pt has his own corest)      Mobility  Bed Mobility Overal bed mobility: Modified Independent             General bed mobility comments: Pt able to get EOB.  Suprisingly laying on his right side is most comfortable (unusual since most people with right sided leg and hip pain prefer to lay on their other side).   Transfers Overall transfer level: Needs assistance Equipment used: Rolling walker (2 wheeled) Transfers: Sit to/from Stand Sit to Stand: Min guard         General transfer comment: Min guard assit for safety, cues for safe hand placement.   Ambulation/Gait Ambulation/Gait assistance: Min guard Gait Distance (Feet): 65 Feet Assistive device: Rolling walker (2 wheeled) Gait Pattern/deviations: Step-through pattern;Decreased dorsiflexion - right;Decreased stance time - right;Decreased weight shift to right;Decreased step length - right;Steppage      General Gait Details: Pt with steppage gait pattern over a soft, flexed knee on  his right.  He has difficulty WB through his right leg both due to pain and decreased stability at his knee.       Balance Overall balance assessment: Needs assistance Sitting-balance support: Feet supported;No upper extremity supported Sitting balance-Leahy Scale: Good Sitting balance - Comments: Pt able to sit EOB without UE support supervision   Standing balance support: Bilateral upper extremity supported Standing balance-Leahy Scale: Poor Standing balance comment: needs some kind of external support in standing, one hand or AD                             Pertinent Vitals/Pain Pain Assessment: 0-10 Pain Score: 8  Pain Location: back , right hip and leg Pain Descriptors / Indicators: Aching;Burning Pain Intervention(s): Monitored during session;Limited activity within patient's tolerance;Repositioned    Home Living Family/patient expects to be discharged to:: Private residence Living Arrangements: Non-relatives/Friends Available Help at Discharge: Friend(s);Available PRN/intermittently Type of Home: House Home Access: Stairs to enter Entrance Stairs-Rails: None Entrance Stairs-Number of Steps: 2 Home Layout: One level Home Equipment: None      Prior Function Level of Independence: Needs assistance   Gait / Transfers Assistance Needed: pt reports he holds heavily to furniture, needs help in and out of the house, has not fallen           Hand Dominance   Dominant Hand: Right    Extremity/Trunk  Assessment   Upper Extremity Assessment Upper Extremity Assessment: Defer to OT evaluation    Lower Extremity Assessment Lower Extremity Assessment: RLE deficits/detail RLE Deficits / Details: right leg with weakness throughout, ankle trace, knee 3-/5, hip flexion 2/5.  Sensation deficit in sock-like pattern from knee down which doesn't match his myotomal weakness, but gait  pattern matches his strength testing.  RLE Sensation: decreased light touch    Cervical / Trunk Assessment Cervical / Trunk Assessment: Other exceptions Cervical / Trunk Exceptions: Per pt report he has a long standing h/o lumbar issues and "paralysis"  Communication   Communication: No difficulties  Cognition Arousal/Alertness: Awake/alert Behavior During Therapy: WFL for tasks assessed/performed Overall Cognitive Status: Within Functional Limits for tasks assessed                                               Assessment/Plan    PT Assessment Patient needs continued PT services  PT Problem List Decreased strength;Decreased activity tolerance;Decreased balance;Decreased mobility;Decreased knowledge of use of DME;Decreased knowledge of precautions;Obesity;Impaired sensation;Pain       PT Treatment Interventions DME instruction;Gait training;Stair training;Functional mobility training;Therapeutic activities;Therapeutic exercise;Balance training;Patient/family education;Modalities    PT Goals (Current goals can be found in the Care Plan section)  Acute Rehab PT Goals Patient Stated Goal: to fix his back PT Goal Formulation: With patient Time For Goal Achievement: 10/19/18 Potential to Achieve Goals: Good    Frequency Min 3X/week        AM-PAC PT "6 Clicks" Mobility  Outcome Measure Help needed turning from your back to your side while in a flat bed without using bedrails?: None Help needed moving from lying on your back to sitting on the side of a flat bed without using bedrails?: None Help needed moving to and from a bed to a chair (including a wheelchair)?: A Little Help needed standing up from a chair using your arms (e.g., wheelchair or bedside chair)?: A Little Help needed to walk in hospital room?: A Little Help needed climbing 3-5 steps with a railing? : A Little 6 Click Score: 20    End of Session   Activity Tolerance: Patient limited by  pain Patient left: Other (comment)(seated EOB) Nurse Communication: Mobility status PT Visit Diagnosis: Difficulty in walking, not elsewhere classified (R26.2);Muscle weakness (generalized) (M62.81);Pain Pain - Right/Left: Right Pain - part of body: Leg    Time: 1314-3888 PT Time Calculation (min) (ACUTE ONLY): 33 min   Charges:          Lurena Joiner B. Kenyan Karnes, PT, DPT  Acute Rehabilitation [phone] pager #[phone] office   PT Evaluation $PT Eval Moderate Complexity: 1 Mod PT Treatments $Gait Training: 8-22 mins        10/05/2018, 12:58 PM

## 2018-10-05 NOTE — Progress Notes (Signed)
   Subjective: Shawn Hines was seen and evaluated on morning rounds. No acute events overnight. He reports some pain relief with Dilaudid which lasts approximately 1 hour. Patient had a bowel movement early this morning. No bowel or bladder incontinence.   Objective:  Vital signs in last 24 hours: Vitals:   10/05/18 0618 10/05/18 0619 10/05/18 0835 10/05/18 1129  BP:   126/74 122/79  Pulse: 78   72  Resp:      Temp: 98.2 F (36.8 C)  98.5 F (36.9 C) 98.3 F (36.8 C)  TempSrc: Oral   Oral  SpO2:    95%  Weight:  101.7 kg    Height:  6' (1.829 m)     General: awake, alert, lying in bed in mild discomfort MSK: RLE with 2/5 hip flexion, 3/5 with knee extension and flexion. 5/5 strength in LLE. Absent patellar reflex on right; 2+ patellar on left; 2+ achilles bilaterally   Assessment/Plan:  Principal Problem:   Lumbar herniated disc Active Problems:   Hypertension   Chronic back pain   Long term current use of opiate analgesic  1. Acute on chronic low back pain - neurosurgery on board; appreciate their recommendations - will attempt to obtain EMG studies to help differentiate nerve involvement - given significant muscle spasms in quadriceps, this may be contributing to neuropathy. Will add Robaxin 1000 mg QID in addition to current pain regimen    2. HTN - blood pressure stable on home Amlodipine and atenolol-chlorthalidone 50-25 mg   3. Migraines - patient takes Depakote 500 mg prn  - denies current headache; will add if needed   Dispo: Anticipated discharge pending further work-up and symptom control.   Lenward Chancellor D, DO 10/05/2018, 2:16 PM Pager: [phone]

## 2018-10-05 NOTE — Evaluation (Signed)
Occupational Therapy Evaluation Patient Details Name: Shawn Hines MRN: [id number] DOB: 1976/07/24 Today's Date: 10/05/2018    History of Present Illness 43 y.o. male admitted on 10/04/18 for acute on chronic low back pain.  Pt with significant PMH of HTN, migraines, neuropathy, and gout.  Pt self reports seizure as well.     Clinical Impression   Patient side-lying upon entry, agreeable to OT.  Admitted for above and limited by pain, decreased safety.  Demonstrates ability to complete bed mobility with independence, UB ADLs with setup assist seated, LB ADLs with min assist, toileting with min assist, transfers with min guard assist, grooming at sink with min assist and short distance functional mobility with min guard to min assist as patient requires cueing for walker mgmt, safety due to R LE foot drop and forward lean.  Reviewed safety and back precautions for comfort.  Will benefit from continued OT services while admitted but anticipate no further needs after dc.      Follow Up Recommendations  No OT follow up;Supervision - Intermittent    Equipment Recommendations  3 in 1 bedside commode    Recommendations for Other Services       Precautions / Restrictions Precautions Precautions: Back Precaution Comments: for comfort(no recall of precautions educated on from PT session) Required Braces or Orthoses: (has braces at home, but not at hospital ) Restrictions Weight Bearing Restrictions: No      Mobility Bed Mobility Overal bed mobility: Modified Independent             General bed mobility comments: laying on R side, transitioning to EOB with independence;educated on log roll for comfort  Transfers Overall transfer level: Needs assistance Equipment used: Rolling walker (2 wheeled) Transfers: Sit to/from Stand Sit to Stand: Min guard         General transfer comment: min guard for safety and balance, cueing for hand placement     Balance Overall balance  assessment: Needs assistance Sitting-balance support: Feet supported;No upper extremity supported Sitting balance-Leahy Scale: Good Sitting balance - Comments: Pt able to sit EOB without UE support supervision   Standing balance support: Bilateral upper extremity supported Standing balance-Leahy Scale: Poor Standing balance comment: needs some kind of external support in standing, one hand or AD                           ADL either performed or assessed with clinical judgement   ADL Overall ADL's : Needs assistance/impaired     Grooming: Minimal assistance;Standing;Wash/dry hands Grooming Details (indicate cue type and reason): cueing to maintain upright posture, min assist for balance  Upper Body Bathing: Set up;Sitting   Lower Body Bathing: Minimal assistance;Sit to/from stand Lower Body Bathing Details (indicate cue type and reason): decreased functional ROM and strength to R LE  Upper Body Dressing : Set up;Sitting   Lower Body Dressing: Minimal assistance;Sit to/from stand Lower Body Dressing Details (indicate cue type and reason): increased forward lean to manage R LE, cueing for no foward lean to protect back  Toilet Transfer: Min guard;Ambulation;RW Toilet Transfer Details (indicate cue type and reason): simulated in room  Toileting- Clothing Manipulation and Hygiene: Minimal assistance;Sit to/from stand       Functional mobility during ADLs: Min guard;Minimal assistance;Rolling walker;Cueing for safety General ADL Comments: patinet limited by pain, impaired safety awareness, and decreased activity tolerance     Vision   Vision Assessment?: No apparent visual deficits  Perception     Praxis      Pertinent Vitals/Pain Pain Assessment: 0-10 Pain Score: 9 (increasing to 10 during mobility) Pain Location: back , right hip and leg Pain Descriptors / Indicators: Aching;Burning Pain Intervention(s): Monitored during session;Repositioned;Patient  requesting pain meds-RN notified     Hand Dominance Right   Extremity/Trunk Assessment Upper Extremity Assessment Upper Extremity Assessment: Overall WFL for tasks assessed   Lower Extremity Assessment Lower Extremity Assessment: Defer to PT evaluation RLE Deficits / Details: right leg with weakness throughout, ankle trace, knee 3-/5, hip flexion 2/5.  Sensation deficit in sock-like pattern from knee down which doesn't match his myotomal weakness, but gait pattern matches his strength testing.  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"   Communication Communication Communication: No difficulties   Cognition Arousal/Alertness: Awake/alert Behavior During Therapy: WFL for tasks assessed/performed Overall Cognitive Status: Within Functional Limits for tasks assessed                                     General Comments       Exercises     Shoulder Instructions      Home Living Family/patient expects to be discharged to:: Private residence Living Arrangements: Non-relatives/Friends Available Help at Discharge: Friend(s);Available PRN/intermittently Type of Home: House Home Access: Stairs to enter [employer] of Steps: 2 Entrance Stairs-Rails: None Home Layout: One level     Bathroom Shower/Tub: Chief Strategy Officer: Standard     Home Equipment: None          Prior Functioning/Environment Level of Independence: Needs assistance  Gait / Transfers Assistance Needed: patient reports furniture walking at home, needing assist in/out of the house  ADL's / Homemaking Assistance Needed: "making do" with ADLs but not needing any assist            OT Problem List: Decreased strength;Decreased activity tolerance;Impaired balance (sitting and/or  standing);Decreased safety awareness;Decreased knowledge of use of DME or AE;Decreased knowledge of precautions;Pain      OT Treatment/Interventions: Self-care/ADL training;Therapeutic exercise;DME and/or AE instruction;Therapeutic activities;Patient/family education;Balance training;Energy conservation    OT Goals(Current goals can be found in the care plan section) Acute Rehab OT Goals Patient Stated Goal: to fix his back OT Goal Formulation: With patient Time For Goal Achievement: 10/19/18 Potential to Achieve Goals: Good  OT Frequency: Min 2X/week   Barriers to D/C:            Co-evaluation              AM-PAC OT "6 Clicks" Daily Activity     Outcome Measure Help from another person eating meals?: None Help from another person taking care of personal grooming?: A Little Help from another person toileting, which includes using toliet, bedpan, or urinal?: A Little Help from another person bathing (including washing, rinsing, drying)?: A Little Help from another person to put on and taking off regular upper body clothing?: None Help from another person to put on and taking off regular lower body clothing?: A Little 6 Click Score: 20   End of Session Equipment Utilized During Treatment: Gait belt;Rolling walker Nurse Communication: Mobility status  Activity Tolerance: Patient limited by pain Patient left: with call bell/phone within reach;Other (comment)(seated EOB )  OT Visit  Diagnosis: Other abnormalities of gait and mobility (R26.89);Muscle weakness (generalized) (M62.81);Pain Pain - Right/Left: Right Pain - part of body: Leg;Hip(back)                Time: [id number] OT Time Calculation (min): 17 min Charges:  OT General Charges $OT Visit: 1 Visit OT Evaluation $OT Eval Low Complexity: 1 Low  Chancy Milroyhristie S Fortunata Betty, OT Acute Rehabilitation Services Pager [phone] Office [phone]   Chancy MilroyChristie S Shellene Sweigert 10/05/2018, 2:29 PM

## 2018-10-06 ENCOUNTER — Other Ambulatory Visit: Payer: Self-pay | Admitting: Neurological Surgery

## 2018-10-06 DIAGNOSIS — M5126 Other intervertebral disc displacement, lumbar region: Secondary | ICD-10-CM

## 2018-10-06 MED ORDER — DEXAMETHASONE SODIUM PHOSPHATE 4 MG/ML IJ SOLN
4.0000 mg | Freq: Four times a day (QID) | INTRAMUSCULAR | Status: AC
  Administered 2018-10-06 – 2018-10-07 (×4): 4 mg via INTRAVENOUS
  Filled 2018-10-06 (×4): qty 1

## 2018-10-06 NOTE — Progress Notes (Signed)
Patient ID: Shawn Hines, male   DOB: [birth year], 43 y.o.   MRN: [id number] Subjective: Patient reports 8.5/10 right-sided low back pain radiating into the right leg in an L4 and L5 distribution.  Pain medications help slightly.  Objective: Vital signs in last 24 hours: Temp:  [97.7 F (36.5 C)-98.6 F (37 C)] 97.9 F (36.6 C) (01/15 1114) Pulse Rate:  [69-79] 70 (01/15 1114) Resp:  [18] 18 (01/14 2000) BP: (99-142)/(56-93) 119/71 (01/15 1114) SpO2:  [97 %-98 %] 97 % (01/15 1114)  Intake/Output from previous day: 01/14 0701 - 01/15 0700 In: 363 [P.O.:360; I.V.:3] Out: -  Intake/Output this shift: Total I/O In: 120 [P.O.:120] Out: -   Neurologic: Grossly normal except for 2 out of 5 strength in the right hip flexor and knee extensor and 3-5 strength in the right dorsiflexor  Lab Results: Lab Results  Component Value Date   WBC 11.2 (H) 10/04/2018   HGB 14.7 10/04/2018   HCT 43.9 10/04/2018   MCV 87.1 10/04/2018   PLT 339 10/04/2018   No results found for: INR, PROTIME BMET Lab Results  Component Value Date   NA 141 10/04/2018   K 4.0 10/04/2018   CL 108 10/04/2018   CO2 23 10/04/2018   GLUCOSE 99 10/04/2018   BUN 11 10/04/2018   CREATININE 0.89 10/04/2018   CALCIUM 9.4 10/04/2018    Studies/Results: No results found.  Assessment/Plan: Right L4-5 herniated disc with superior free fragment compressing the right L4 and L5 nerve roots causing severe right leg pain and weakness in the right hip flexor, quadricep, and dorsiflexors.  With the severe pain and the weakness I have recommended a right L4-5 microdiscectomy.  I have gone over this with him in detail.  I tried to answer his questions.  He understands the risk of the surgery include but are not limited to bleeding, infection, CSF leak, nerve root injury, numbness, weakness, lack of relief of symptoms, worsening symptoms, need for further surgery, vascular injury, loss of bowel bladder or sexual function,  instability, and anesthesia risk including DVT pneumonia MI and death.  He agrees to proceed we will do so tomorrow.  Estimated body mass index is 30.41 kg/m as calculated from the following:   Height as of this encounter: 6' (1.829 m).   Weight as of this encounter: 101.7 kg.    LOS: 2 days    Tia Alert 10/06/2018, 12:24 PM

## 2018-10-06 NOTE — Progress Notes (Signed)
   Subjective: Shawn Hines was seen and evaluated at bedside. No acute events overnight. Pain is mildly improved; waxes and wanes between 7-10. Still endorses severe spasms in right quadriceps, as well as burning pain in his gluteal region and numbness/tingling below his knee.   Objective:  Vital signs in last 24 hours: Vitals:   10/06/18 0037 10/06/18 0457 10/06/18 0747 10/06/18 1114  BP: (!) 112/56 110/73 (!) 142/93 119/71  Pulse: 72 69 73 70  Resp:      Temp: 98.2 F (36.8 C) 98.2 F (36.8 C) 97.7 F (36.5 C) 97.9 F (36.6 C)  TempSrc: Oral Oral Oral Oral  SpO2:   97% 97%  Weight:      Height:       General: awake, alert, sitting up in bed in NAD Neuro: 2/5 strength with hip flexion and knee extension. 3/5 with dorsiflexion. Decreased sensation below knee on RLE.  MSK: TTP of right lower lumbar paraspinal muscles   Assessment/Plan:  Principal Problem:   Lumbar herniated disc Active Problems:   Hypertension   Chronic back pain   Long term current use of opiate analgesic  1. Lumbar herniated disc - greatly appreciate neurosurgery consult; plan to proceed with microdiscectomy tomorrow - will attempt to obtain EMG studies to help differentiate nerve involvement - will continue current pain regimen - decadron 4 q 6 for 24 hours per neurosurgery; will monitor CBGs q 8 hours   2. HTN - blood pressure stable on home Amlodipine and atenolol-chlorthalidone 50-25 mg   3. Migraines - patient takes Depakote 500 mg prn  - denies current headache; will add if needed   Dispo: Anticipated discharge in approximately 2-3 pending recovery following surgical intervention.   Lenward Chancellor D, DO 10/06/2018, 1:49 PM Pager: [phone]

## 2018-10-07 ENCOUNTER — Inpatient Hospital Stay (HOSPITAL_COMMUNITY): Payer: Self-pay

## 2018-10-07 ENCOUNTER — Inpatient Hospital Stay (HOSPITAL_COMMUNITY): Payer: Self-pay | Admitting: Certified Registered"

## 2018-10-07 ENCOUNTER — Encounter (HOSPITAL_COMMUNITY): Payer: Self-pay | Admitting: Anesthesiology

## 2018-10-07 ENCOUNTER — Encounter (HOSPITAL_COMMUNITY)
Admission: EM | Disposition: A | Discharge status: Home Health | Payer: Self-pay | Source: Home / Self Care | Attending: Internal Medicine

## 2018-10-07 DIAGNOSIS — Z9889 Other specified postprocedural states: Secondary | ICD-10-CM

## 2018-10-07 HISTORY — PX: LUMBAR LAMINECTOMY/DECOMPRESSION MICRODISCECTOMY: SHX5026

## 2018-10-07 SURGERY — LUMBAR LAMINECTOMY/DECOMPRESSION MICRODISCECTOMY 1 LEVEL
Anesthesia: General | Site: Back | Laterality: Right

## 2018-10-07 MED ORDER — SODIUM CHLORIDE 0.9 % IV SOLN: 250.0000 mL | INTRAVENOUS | Status: DC

## 2018-10-07 MED ORDER — BUPIVACAINE HCL (PF) 0.25 % IJ SOLN
INTRAMUSCULAR | Status: DC | PRN
  Administered 2018-10-07: 4 mL

## 2018-10-07 MED ORDER — FENTANYL CITRATE (PF) 250 MCG/5ML IJ SOLN
INTRAMUSCULAR | Status: AC
  Filled 2018-10-07: qty 5

## 2018-10-07 MED ORDER — DEXAMETHASONE 4 MG PO TABS
4.0000 mg | ORAL_TABLET | Freq: Four times a day (QID) | ORAL | Status: DC
  Administered 2018-10-08 – 2018-10-11 (×14): 4 mg via ORAL
  Filled 2018-10-07 (×15): qty 1

## 2018-10-07 MED ORDER — DEXAMETHASONE SODIUM PHOSPHATE 4 MG/ML IJ SOLN
4.0000 mg | Freq: Four times a day (QID) | INTRAMUSCULAR | Status: DC
  Administered 2018-10-07 – 2018-10-08 (×2): 4 mg via INTRAVENOUS
  Filled 2018-10-07 (×3): qty 1

## 2018-10-07 MED ORDER — ROCURONIUM BROMIDE 10 MG/ML (PF) SYRINGE
PREFILLED_SYRINGE | INTRAVENOUS | Status: DC | PRN
  Administered 2018-10-07: 10 mg via INTRAVENOUS
  Administered 2018-10-07: 20 mg via INTRAVENOUS
  Administered 2018-10-07: 50 mg via INTRAVENOUS

## 2018-10-07 MED ORDER — KETAMINE HCL 50 MG/5ML IJ SOSY
PREFILLED_SYRINGE | INTRAMUSCULAR | Status: AC
  Filled 2018-10-07: qty 5

## 2018-10-07 MED ORDER — LIDOCAINE 2% (20 MG/ML) 5 ML SYRINGE
INTRAMUSCULAR | Status: DC | PRN
  Administered 2018-10-07: 60 mg via INTRAVENOUS

## 2018-10-07 MED ORDER — MIDAZOLAM HCL 5 MG/5ML IJ SOLN
INTRAMUSCULAR | Status: DC | PRN
  Administered 2018-10-07 (×2): 2 mg via INTRAVENOUS

## 2018-10-07 MED ORDER — OXYCODONE HCL 5 MG/5ML PO SOLN: 5.0000 mg | Freq: Once | ORAL | Status: AC | PRN

## 2018-10-07 MED ORDER — ACETAMINOPHEN 325 MG PO TABS
650.0000 mg | ORAL_TABLET | ORAL | Status: DC | PRN
  Administered 2018-10-08 – 2018-10-09 (×2): 650 mg via ORAL
  Filled 2018-10-07 (×2): qty 2

## 2018-10-07 MED ORDER — CEFAZOLIN SODIUM-DEXTROSE 2-4 GM/100ML-% IV SOLN
2.0000 g | Freq: Three times a day (TID) | INTRAVENOUS | Status: AC
  Administered 2018-10-07 – 2018-10-08 (×2): 2 g via INTRAVENOUS
  Filled 2018-10-07 (×2): qty 100

## 2018-10-07 MED ORDER — ACETAMINOPHEN 650 MG RE SUPP: 650.0000 mg | RECTAL | Status: DC | PRN

## 2018-10-07 MED ORDER — HYDROMORPHONE HCL 1 MG/ML IJ SOLN
1.0000 mg | INTRAMUSCULAR | Status: DC | PRN
  Administered 2018-10-07 – 2018-10-10 (×11): 1 mg via INTRAVENOUS
  Filled 2018-10-07 (×12): qty 1

## 2018-10-07 MED ORDER — POTASSIUM CHLORIDE IN NACL 20-0.9 MEQ/L-% IV SOLN
INTRAVENOUS | Status: DC
  Administered 2018-10-07 – 2018-10-08 (×2): via INTRAVENOUS
  Filled 2018-10-07 (×2): qty 1000

## 2018-10-07 MED ORDER — CEFAZOLIN SODIUM-DEXTROSE 2-4 GM/100ML-% IV SOLN
2.0000 g | INTRAVENOUS | Status: AC
  Administered 2018-10-07: 2 g via INTRAVENOUS
  Filled 2018-10-07: qty 100

## 2018-10-07 MED ORDER — ONDANSETRON HCL 4 MG/2ML IJ SOLN: 4.0000 mg | Freq: Once | INTRAMUSCULAR | Status: DC | PRN

## 2018-10-07 MED ORDER — MIDAZOLAM HCL 2 MG/2ML IJ SOLN
INTRAMUSCULAR | Status: AC
  Filled 2018-10-07: qty 2

## 2018-10-07 MED ORDER — ONDANSETRON HCL 4 MG/2ML IJ SOLN
INTRAMUSCULAR | Status: AC
  Filled 2018-10-07: qty 2

## 2018-10-07 MED ORDER — MENTHOL 3 MG MT LOZG: 1.0000 | LOZENGE | OROMUCOSAL | Status: DC | PRN

## 2018-10-07 MED ORDER — METHOCARBAMOL 1000 MG/10ML IJ SOLN
500.0000 mg | Freq: Four times a day (QID) | INTRAVENOUS | Status: DC | PRN
  Administered 2018-10-07 – 2018-10-08 (×2): 500 mg via INTRAVENOUS
  Filled 2018-10-07 (×3): qty 5

## 2018-10-07 MED ORDER — ROCURONIUM BROMIDE 50 MG/5ML IV SOSY
PREFILLED_SYRINGE | INTRAVENOUS | Status: AC
  Filled 2018-10-07: qty 5

## 2018-10-07 MED ORDER — HYDROMORPHONE HCL 1 MG/ML IJ SOLN
INTRAMUSCULAR | Status: AC
  Administered 2018-10-07: 14:00:00
  Filled 2018-10-07: qty 1

## 2018-10-07 MED ORDER — THROMBIN 5000 UNITS EX SOLR
CUTANEOUS | Status: DC | PRN
  Administered 2018-10-07 (×2): 5000 [IU] via TOPICAL

## 2018-10-07 MED ORDER — BUPIVACAINE HCL (PF) 0.25 % IJ SOLN
INTRAMUSCULAR | Status: AC
  Filled 2018-10-07: qty 30

## 2018-10-07 MED ORDER — 0.9 % SODIUM CHLORIDE (POUR BTL) OPTIME
TOPICAL | Status: DC | PRN
  Administered 2018-10-07: 1000 mL

## 2018-10-07 MED ORDER — LACTATED RINGERS IV SOLN
INTRAVENOUS | Status: DC | PRN
  Administered 2018-10-07 (×2): via INTRAVENOUS

## 2018-10-07 MED ORDER — ONDANSETRON HCL 4 MG/2ML IJ SOLN: 4.0000 mg | Freq: Four times a day (QID) | INTRAMUSCULAR | Status: DC | PRN

## 2018-10-07 MED ORDER — THROMBIN 5000 UNITS EX SOLR
CUTANEOUS | Status: AC
  Filled 2018-10-07: qty 15000

## 2018-10-07 MED ORDER — DEXAMETHASONE SODIUM PHOSPHATE 10 MG/ML IJ SOLN
INTRAMUSCULAR | Status: AC
  Filled 2018-10-07: qty 1

## 2018-10-07 MED ORDER — CHLORHEXIDINE GLUCONATE CLOTH 2 % EX PADS
6.0000 | MEDICATED_PAD | Freq: Once | CUTANEOUS | Status: DC
  Administered 2018-10-07: 6 via TOPICAL

## 2018-10-07 MED ORDER — PHENOL 1.4 % MT LIQD: 1.0000 | OROMUCOSAL | Status: DC | PRN

## 2018-10-07 MED ORDER — SODIUM CHLORIDE 0.9% FLUSH: 3.0000 mL | INTRAVENOUS | Status: DC | PRN

## 2018-10-07 MED ORDER — SODIUM CHLORIDE 0.9% FLUSH
3.0000 mL | Freq: Two times a day (BID) | INTRAVENOUS | Status: DC
  Administered 2018-10-07 – 2018-10-11 (×8): 3 mL via INTRAVENOUS

## 2018-10-07 MED ORDER — DEXAMETHASONE SODIUM PHOSPHATE 10 MG/ML IJ SOLN
10.0000 mg | INTRAMUSCULAR | Status: DC
  Filled 2018-10-07: qty 1

## 2018-10-07 MED ORDER — MIDAZOLAM HCL 2 MG/2ML IJ SOLN
INTRAMUSCULAR | Status: AC
  Administered 2018-10-07: 14:00:00
  Filled 2018-10-07: qty 2

## 2018-10-07 MED ORDER — HYDROMORPHONE HCL 1 MG/ML IJ SOLN
0.2500 mg | INTRAMUSCULAR | Status: DC | PRN
  Administered 2018-10-07 (×4): 0.5 mg via INTRAVENOUS

## 2018-10-07 MED ORDER — PROPOFOL 10 MG/ML IV BOLUS
INTRAVENOUS | Status: DC | PRN
  Administered 2018-10-07: 170 mg via INTRAVENOUS

## 2018-10-07 MED ORDER — OXYCODONE HCL 5 MG PO TABS
ORAL_TABLET | ORAL | Status: AC
  Administered 2018-10-07: 14:00:00
  Filled 2018-10-07: qty 1

## 2018-10-07 MED ORDER — CHLORHEXIDINE GLUCONATE CLOTH 2 % EX PADS
6.0000 | MEDICATED_PAD | Freq: Once | CUTANEOUS | Status: AC
  Administered 2018-10-07: 6 via TOPICAL

## 2018-10-07 MED ORDER — FENTANYL CITRATE (PF) 100 MCG/2ML IJ SOLN
INTRAMUSCULAR | Status: DC | PRN
  Administered 2018-10-07: 150 ug via INTRAVENOUS

## 2018-10-07 MED ORDER — DEXAMETHASONE SODIUM PHOSPHATE 10 MG/ML IJ SOLN
INTRAMUSCULAR | Status: DC | PRN
  Administered 2018-10-07: 10 mg via INTRAVENOUS

## 2018-10-07 MED ORDER — MIDAZOLAM HCL 2 MG/2ML IJ SOLN
2.0000 mg | Freq: Once | INTRAMUSCULAR | Status: AC
  Administered 2018-10-07: 2 mg via INTRAVENOUS

## 2018-10-07 MED ORDER — LIDOCAINE 2% (20 MG/ML) 5 ML SYRINGE
INTRAMUSCULAR | Status: AC
  Filled 2018-10-07: qty 5

## 2018-10-07 MED ORDER — ONDANSETRON HCL 4 MG/2ML IJ SOLN
INTRAMUSCULAR | Status: DC | PRN
  Administered 2018-10-07: 4 mg via INTRAVENOUS

## 2018-10-07 MED ORDER — SUGAMMADEX SODIUM 200 MG/2ML IV SOLN
INTRAVENOUS | Status: DC | PRN
  Administered 2018-10-07: 200 mg via INTRAVENOUS

## 2018-10-07 MED ORDER — DEXMEDETOMIDINE HCL 200 MCG/2ML IV SOLN
INTRAVENOUS | Status: DC | PRN
  Administered 2018-10-07 (×9): 8 ug via INTRAVENOUS

## 2018-10-07 MED ORDER — HEMOSTATIC AGENTS (NO CHARGE) OPTIME
TOPICAL | Status: DC | PRN
  Administered 2018-10-07: 1 via TOPICAL

## 2018-10-07 MED ORDER — SODIUM CHLORIDE 0.9 % IV SOLN
INTRAVENOUS | Status: DC | PRN
  Administered 2018-10-07: 500 mL

## 2018-10-07 MED ORDER — OXYCODONE HCL 5 MG PO TABS
5.0000 mg | ORAL_TABLET | Freq: Once | ORAL | Status: AC | PRN
  Administered 2018-10-07: 5 mg via ORAL

## 2018-10-07 MED ORDER — ONDANSETRON HCL 4 MG PO TABS
4.0000 mg | ORAL_TABLET | Freq: Four times a day (QID) | ORAL | Status: DC | PRN
  Administered 2018-10-08: 4 mg via ORAL
  Filled 2018-10-07: qty 1

## 2018-10-07 MED ORDER — SENNA 8.6 MG PO TABS: 1.0000 | ORAL_TABLET | Freq: Two times a day (BID) | ORAL | Status: DC

## 2018-10-07 MED ORDER — PROPOFOL 10 MG/ML IV BOLUS
INTRAVENOUS | Status: AC
  Filled 2018-10-07: qty 20

## 2018-10-07 MED ORDER — KETAMINE HCL 10 MG/ML IJ SOLN
INTRAMUSCULAR | Status: DC | PRN
  Administered 2018-10-07: 50 mg via INTRAVENOUS

## 2018-10-07 MED ORDER — THROMBIN 5000 UNITS EX SOLR
OROMUCOSAL | Status: DC | PRN
  Administered 2018-10-07: 5 mL via TOPICAL

## 2018-10-07 MED ORDER — HYDROMORPHONE HCL 1 MG/ML IJ SOLN
INTRAMUSCULAR | Status: AC
  Administered 2018-10-07: 13:00:00
  Filled 2018-10-07: qty 1

## 2018-10-07 MED ORDER — METHOCARBAMOL 500 MG PO TABS
500.0000 mg | ORAL_TABLET | Freq: Four times a day (QID) | ORAL | Status: DC | PRN
  Administered 2018-10-07 – 2018-10-11 (×6): 500 mg via ORAL
  Filled 2018-10-07 (×7): qty 1

## 2018-10-07 SURGICAL SUPPLY — 57 items
BAG DECANTER FOR FLEXI CONT (MISCELLANEOUS) ×3 IMPLANT
BENZOIN TINCTURE PRP APPL 2/3 (GAUZE/BANDAGES/DRESSINGS) ×3 IMPLANT
BUR MATCHSTICK NEURO 3.0 LAGG (BURR) ×3 IMPLANT
CANISTER SUCT 3000ML PPV (MISCELLANEOUS) ×3 IMPLANT
CARTRIDGE OIL MAESTRO DRILL (MISCELLANEOUS) ×1 IMPLANT
CLOSURE WOUND 1/2 X4 (GAUZE/BANDAGES/DRESSINGS) ×1
COVER WAND RF STERILE (DRAPES) ×3 IMPLANT
DERMABOND ADVANCED (GAUZE/BANDAGES/DRESSINGS) ×2
DERMABOND ADVANCED .7 DNX12 (GAUZE/BANDAGES/DRESSINGS) IMPLANT
DIFFUSER DRILL AIR PNEUMATIC (MISCELLANEOUS) ×3 IMPLANT
DRAPE HALF SHEET 40X57 (DRAPES) ×2 IMPLANT
DRAPE LAPAROTOMY 100X72X124 (DRAPES) ×3 IMPLANT
DRAPE MICROSCOPE LEICA (MISCELLANEOUS) ×3 IMPLANT
DRAPE POUCH INSTRU U-SHP 10X18 (DRAPES) ×3 IMPLANT
DRAPE SURG 17X23 STRL (DRAPES) ×3 IMPLANT
DRSG OPSITE POSTOP 3X4 (GAUZE/BANDAGES/DRESSINGS) ×2 IMPLANT
DURAPREP 26ML APPLICATOR (WOUND CARE) ×3 IMPLANT
ELECT REM PT RETURN 9FT ADLT (ELECTROSURGICAL) ×3
ELECTRODE REM PT RTRN 9FT ADLT (ELECTROSURGICAL) ×1 IMPLANT
GAUZE 4X4 16PLY RFD (DISPOSABLE) IMPLANT
GLOVE BIO SURGEON STRL SZ 6.5 (GLOVE) ×1 IMPLANT
GLOVE BIO SURGEON STRL SZ7 (GLOVE) ×2 IMPLANT
GLOVE BIO SURGEON STRL SZ8 (GLOVE) ×5 IMPLANT
GLOVE BIO SURGEONS STRL SZ 6.5 (GLOVE) ×1
GLOVE BIOGEL PI IND STRL 6.5 (GLOVE) IMPLANT
GLOVE BIOGEL PI IND STRL 7.0 (GLOVE) IMPLANT
GLOVE BIOGEL PI IND STRL 7.5 (GLOVE) IMPLANT
GLOVE BIOGEL PI INDICATOR 6.5 (GLOVE) ×2
GLOVE BIOGEL PI INDICATOR 7.0 (GLOVE) ×4
GLOVE BIOGEL PI INDICATOR 7.5 (GLOVE) ×2
GLOVE SURG SS PI 7.0 STRL IVOR (GLOVE) ×8 IMPLANT
GOWN STRL REUS W/ TWL LRG LVL3 (GOWN DISPOSABLE) IMPLANT
GOWN STRL REUS W/ TWL XL LVL3 (GOWN DISPOSABLE) ×1 IMPLANT
GOWN STRL REUS W/TWL 2XL LVL3 (GOWN DISPOSABLE) IMPLANT
GOWN STRL REUS W/TWL LRG LVL3 (GOWN DISPOSABLE) ×6
GOWN STRL REUS W/TWL XL LVL3 (GOWN DISPOSABLE) ×2
HEMOSTAT POWDER KIT SURGIFOAM (HEMOSTASIS) ×2 IMPLANT
KIT BASIN OR (CUSTOM PROCEDURE TRAY) ×3 IMPLANT
KIT TURNOVER KIT B (KITS) ×3 IMPLANT
NDL HYPO 25X1 1.5 SAFETY (NEEDLE) ×1 IMPLANT
NDL SPNL 20GX3.5 QUINCKE YW (NEEDLE) IMPLANT
NEEDLE HYPO 25X1 1.5 SAFETY (NEEDLE) ×3 IMPLANT
NEEDLE SPNL 20GX3.5 QUINCKE YW (NEEDLE) ×3 IMPLANT
NS IRRIG 1000ML POUR BTL (IV SOLUTION) ×3 IMPLANT
OIL CARTRIDGE MAESTRO DRILL (MISCELLANEOUS) ×3
PACK LAMINECTOMY NEURO (CUSTOM PROCEDURE TRAY) ×3 IMPLANT
PAD ARMBOARD 7.5X6 YLW CONV (MISCELLANEOUS) ×9 IMPLANT
RUBBERBAND STERILE (MISCELLANEOUS) ×6 IMPLANT
SPONGE SURGIFOAM ABS GEL SZ50 (HEMOSTASIS) ×2 IMPLANT
STRIP CLOSURE SKIN 1/2X4 (GAUZE/BANDAGES/DRESSINGS) ×2 IMPLANT
SUT VIC AB 0 CT1 18XCR BRD8 (SUTURE) ×1 IMPLANT
SUT VIC AB 0 CT1 8-18 (SUTURE) ×2
SUT VIC AB 2-0 CP2 18 (SUTURE) ×3 IMPLANT
SUT VIC AB 3-0 SH 8-18 (SUTURE) ×3 IMPLANT
TOWEL GREEN STERILE (TOWEL DISPOSABLE) ×3 IMPLANT
TOWEL GREEN STERILE FF (TOWEL DISPOSABLE) ×3 IMPLANT
WATER STERILE IRR 1000ML POUR (IV SOLUTION) ×3 IMPLANT

## 2018-10-07 NOTE — Anesthesia Postprocedure Evaluation (Signed)
Anesthesia Post Note  Patient: Shawn Hines  Procedure(s) Performed: Right Lumbar four-five microdiscectomy (Right Back)     Patient location during evaluation: PACU Anesthesia Type: General Level of consciousness: awake and alert Pain management: pain level controlled Vital Signs Assessment: post-procedure vital signs reviewed and stable Respiratory status: spontaneous breathing, nonlabored ventilation, respiratory function stable and patient connected to nasal cannula oxygen Cardiovascular status: blood pressure returned to baseline and stable Postop Assessment: no apparent nausea or vomiting Anesthetic complications: no    Last Vitals:  Vitals:   10/07/18 1440 10/07/18 2000  BP: 137/82 (!) 116/59  Pulse: 63 79  Resp:    Temp: 36.6 C 36.6 C  SpO2: 98%     Last Pain:  Vitals:   10/07/18 2000  TempSrc: Oral  PainSc:                  Roslin Norwood COKER

## 2018-10-07 NOTE — Transfer of Care (Signed)
Immediate Anesthesia Transfer of Care Note  Patient: Shawn Hines  Procedure(s) Performed: Right Lumbar four-five microdiscectomy (Right Back)  Patient Location: PACU  Anesthesia Type:General  Level of Consciousness: sedated and patient cooperative  Airway & Oxygen Therapy: Patient Spontanous Breathing and Patient connected to face mask oxygen  Post-op Assessment: Report given to RN and Post -op Vital signs reviewed and stable  Post vital signs: Reviewed  Last Vitals:  Vitals Value Taken Time  BP 94/68 10/07/2018 12:21 PM  Temp    Pulse 72 10/07/2018 12:25 PM  Resp 17 10/07/2018 12:25 PM  SpO2 99 % 10/07/2018 12:25 PM  Vitals shown include unvalidated device data.  Last Pain:  Vitals:   10/07/18 0829  TempSrc: Oral  PainSc:       Patients Stated Pain Goal: 4 (10/07/18 0754)  Complications: No apparent anesthesia complications

## 2018-10-07 NOTE — Progress Notes (Signed)
   Subjective: Mr. Kreiser was seen and evaluated at bedside. No acute events overnight. His quadricep spasms have improved. Back pain and leg weakness remain unchanged. He is anxious for surgery later this afternoon.   Objective:  Vital signs in last 24 hours: Vitals:   10/07/18 1320 10/07/18 1330 10/07/18 1335 10/07/18 1345  BP: 130/78  125/82   Pulse: 66 66 66 65  Resp: 19 13 17 20   Temp:      TempSrc:      SpO2: 100% 100% 98% 100%  Weight:      Height:       General: awake, alert, sitting up in bed in NAD CV: RRR; no murmurs, rubs or gallops Neuro: RLE 3/5 gross motor strength; decreased sensation starting below right knee, unchanged from previous exams  Assessment/Plan:  Principal Problem:   Lumbar herniated disc s/p  R L4-5 microdiscectomy Active Problems:   Hypertension   Chronic back pain   Long term current use of opiate analgesic   S/P lumbar laminectomy  1. Lumbar herniated disc -greatly appreciate neurosurgery consult; microdiscectomy today; will follow-up recommendations post-op - continue Oxy and Dilaudid for pain control - Miralax and Senokot for bowel regimen  - will need PT/OT treatments following surgery   2. HTN -blood pressure stable on home Amlodipine and atenolol-chlorthalidone 50-25 mg  3. Migraines - patient takes Depakote 500 mg prn  - denies current headache; will add if needed  Dispo: Anticipated discharge pending adequate post-op pain management.   Lenward Chancellor D, DO 10/07/2018, 2:16 PM Pager: [phone]

## 2018-10-07 NOTE — Anesthesia Procedure Notes (Signed)
Procedure Name: Intubation Date/Time: 10/07/2018 10:47 AM Performed by: Lovie Chol, CRNA Pre-anesthesia Checklist: Patient identified, Emergency Drugs available, Suction available and Patient being monitored Patient Re-evaluated:Patient Re-evaluated prior to induction Oxygen Delivery Method: Circle System Utilized Preoxygenation: Pre-oxygenation with 100% oxygen Induction Type: IV induction Ventilation: Mask ventilation without difficulty and Oral airway inserted - appropriate to patient size Laryngoscope Size: Hyacinth Meeker and 3 Grade View: Grade I Tube type: Oral Tube size: 7.5 mm Number of attempts: 1 Airway Equipment and Method: Stylet and Oral airway Placement Confirmation: ETT inserted through vocal cords under direct vision,  positive ETCO2 and breath sounds checked- equal and bilateral Secured at: 23 cm Tube secured with: Tape Dental Injury: Teeth and Oropharynx as per pre-operative assessment

## 2018-10-07 NOTE — Progress Notes (Signed)
Patient in more back pain after: 1) ambulating to the bathroom after going over POC after RN not to get up and Urinal at the bedside and  2) literally flipping over from his right side to the back to get on his back after being instructed to log roll. Patient given another dose of Dilaudid after IV Robaxin with lights dimmed and instructed to please give the meds time to take effect and relax. Patient agreed to try.

## 2018-10-07 NOTE — Anesthesia Preprocedure Evaluation (Addendum)
Anesthesia Evaluation  Patient identified by MRN, date of birth, ID band Patient awake    Reviewed: Allergy & Precautions, NPO status , Patient's Chart, lab work & pertinent test results  Airway Mallampati: II  TM Distance: >3 FB Neck ROM: Full    Dental  (+) Poor Dentition, Dental Advisory Given,    Pulmonary Current Smoker,    breath sounds clear to auscultation       Cardiovascular hypertension, Pt. on medications and Pt. on home beta blockers  Rhythm:Regular Rate:Normal     Neuro/Psych    GI/Hepatic (+)     substance abuse  ,   Endo/Other    Renal/GU      Musculoskeletal  (+) narcotic dependent  Abdominal   Peds  Hematology   Anesthesia Other Findings   Reproductive/Obstetrics                            Anesthesia Physical Anesthesia Plan  ASA: II  Anesthesia Plan: General   Post-op Pain Management:    Induction: Intravenous  PONV Risk Score and Plan: Ondansetron and Dexamethasone  Airway Management Planned: Oral ETT  Additional Equipment:   Intra-op Plan:   Post-operative Plan: Extubation in OR  Informed Consent: I have reviewed the patients History and Physical, chart, labs and discussed the procedure including the risks, benefits and alternatives for the proposed anesthesia with the patient or authorized representative who has indicated his/her understanding and acceptance.     Dental advisory given  Plan Discussed with: CRNA and Anesthesiologist  Anesthesia Plan Comments: (Chronic Low Back Pain Hypertension  Plan GA with precedex, ketamine IV acetaminophen  Kipp Brood)        Anesthesia Quick Evaluation

## 2018-10-07 NOTE — Progress Notes (Signed)
PT Cancellation Note  Patient Details Name: Braden Furlong MRN: [id number] DOB: 25-Feb-1976   Cancelled Treatment:    Reason Eval/Treat Not Completed: Patient at procedure or test/unavailable.  Pt is in the OR for back surgery.  PT will re-evaluate tomorrow.  Thanks,  Rollene Rotunda. Jsean Taussig, PT, DPT  Acute Rehabilitation [phone] pager [phone]) [phone] office    Lurena Joiner B Glenora Morocho 10/07/2018, 10:26 AM

## 2018-10-07 NOTE — Op Note (Signed)
10/07/2018  12:09 PM  PATIENT:  Shawn Hines  43 y.o. male  PRE-OPERATIVE DIAGNOSIS: Right L4-5 herniated nucleus pulposus with superior free fragment causing right L4 and L5 radiculopathy with leg weakness  POST-OPERATIVE DIAGNOSIS:  same  PROCEDURE: Right L4-5 hemilaminectomy, medial facetectomy, foraminotomies followed by microdiscectomy L4-5 on the right utilizing microscopic dissection  SURGEON:  Marikay Alar, MD  ASSISTANTS: Verlin Dike FNP  ANESTHESIA:   General  EBL: 25 ml  Total I/O In: 1000 [I.V.:1000] Out: 25 [Blood:25]  BLOOD ADMINISTERED: none  DRAINS: none  SPECIMEN:  none  INDICATION FOR PROCEDURE: This patient presented with your right leg pain with leg weakness. Imaging showed right-sided disc herniation with superior free fragment compressing both the right L4 and L5 nerve roots. The patient tried conservative measures without relief. Pain was debilitating. Recommended right L4-5 microdiscectomy. Patient understood the risks, benefits, and alternatives and potential outcomes and wished to proceed.  PROCEDURE DETAILS: The patient was taken to the operating room and after induction of adequate generalized endotracheal anesthesia, the patient was rolled into the prone position on the Wilson frame and all pressure points were padded. The lumbar region was cleaned and then prepped with DuraPrep and draped in the usual sterile fashion. 5 cc of local anesthesia was injected and then a dorsal midline incision was made and carried down to the lumbo sacral fascia. The fascia was opened and the paraspinous musculature was taken down in a subperiosteal fashion to expose L4 5 on the right. Intraoperative x-ray confirmed my level, and then I used a combination of the high-speed drill and the Kerrison punches to perform a hemilaminectomy, medial facetectomy, and foraminotomy at L4-5 on the right. The underlying yellow ligament was opened and removed in a piecemeal fashion to  expose the underlying dura and exiting nerve root. I undercut the lateral recess and dissected down until I was medial to and distal to the pedicle. The nerve root was well decompressed. We then gently retracted the nerve root medially with a retractor, coagulated the epidural venous vasculature, and found a large superior free fragment underneath the L4 nerve root.  We incised the capsule over the free fragment and then removed it with a nerve hook and pituitary rongeur.  I then palpated with a coronary dilator along the nerve root and into the foramen to assure adequate decompression. I felt no more compression of the nerve root. I irrigated with saline solution containing bacitracin. Achieved hemostasis with bipolar cautery, , and then closed the fascia with 0 Vicryl. I closed the subcutaneous tissues with 2-0 Vicryl and the subcuticular tissues with 3-0 Vicryl. The skin was then closed with benzoin and Steri-Strips. The drapes were removed, a sterile dressing was applied. The patient was awakened from general anesthesia and transferred to the recovery room in stable condition. At the end of the procedure all sponge, needle and instrument counts were correct.    PLAN OF CARE: Admit to inpatient   PATIENT DISPOSITION:  PACU - hemodynamically stable.   Delay start of Pharmacological VTE agent (>24hrs) due to surgical blood loss or risk of bleeding:  yes

## 2018-10-07 NOTE — Care Management Note (Signed)
Case Management Note  Patient Details  Name: Shawn Hines MRN: [id number] Date of Birth: [birth year]  Subjective/Objective:  Pt admitted with back pain s/p RT L4-5 hemilaminectomy, medial facetectomy, foraminotomies followed by microdiscectomy L4-5 on the right utilizing microscopic dissection.  PTA, pt needs assistance with ADLS, lives with friends.                   Action/Plan: PT recommending HH follow up, DME.  Await post operative therapy evaluation for final recommendations.  Pt uninsured, but if HH/DME needed, will evaluate for HH/DME assistance by [facility] charity program.    Expected Discharge Date:                  Expected Discharge Plan:  Home w Home Health Services  In-House Referral:     Discharge planning Services  CM Consult  Post Acute Care Choice:  Home Health Choice offered to:     DME Arranged:    DME Agency:     HH Arranged:    HH Agency:     Status of Service:  In process, will continue to follow  If discussed at Long Length of Stay Meetings, dates discussed:    Additional Comments:  Quintella Baton, RN, BSN  Trauma/Neuro ICU Case Manager [phone]

## 2018-10-08 ENCOUNTER — Encounter (HOSPITAL_COMMUNITY): Payer: Self-pay | Admitting: Neurological Surgery

## 2018-10-08 DIAGNOSIS — M25561 Pain in right knee: Secondary | ICD-10-CM

## 2018-10-08 DIAGNOSIS — H538 Other visual disturbances: Secondary | ICD-10-CM

## 2018-10-08 DIAGNOSIS — Z9889 Other specified postprocedural states: Secondary | ICD-10-CM

## 2018-10-08 LAB — BASIC METABOLIC PANEL
Anion gap: 12 (ref 5–15)
BUN: 18 mg/dL (ref 6–20)
CO2: 22 mmol/L (ref 22–32)
CREATININE: 1.1 mg/dL (ref 0.61–1.24)
Calcium: 9.4 mg/dL (ref 8.9–10.3)
Chloride: 102 mmol/L (ref 98–111)
GFR calc non Af Amer: >60 mL/min (ref >60)
Glucose, Bld: 126 mg/dL — ABNORMAL HIGH (ref 70–99)
Potassium: 4.4 mmol/L (ref 3.5–5.1)
SODIUM: 136 mmol/L (ref 135–145)

## 2018-10-08 LAB — CBC
HCT: 44.8 % (ref 39.0–52.0)
Hemoglobin: 15.1 g/dL (ref 13.0–17.0)
MCH: 29 pg (ref 26.0–34.0)
MCHC: 33.7 g/dL (ref 30.0–36.0)
MCV: 86.2 fL (ref 80.0–100.0)
Platelets: 411 10*3/uL — ABNORMAL HIGH (ref 150–400)
RBC: 5.2 MIL/uL (ref 4.22–5.81)
RDW: 12.7 % (ref 11.5–15.5)
WBC: 25.4 10*3/uL — AB (ref 4.0–10.5)
nRBC: 0 % (ref 0.0–0.2)

## 2018-10-08 MED ORDER — ENOXAPARIN SODIUM 40 MG/0.4ML ~~LOC~~ SOLN
40.0000 mg | SUBCUTANEOUS | Status: DC
  Administered 2018-10-08 – 2018-10-10 (×3): 40 mg via SUBCUTANEOUS
  Filled 2018-10-08 (×3): qty 0.4

## 2018-10-08 NOTE — Progress Notes (Signed)
Patient ID: Shawn Hines, male   DOB: 1976/08/12, 43 y.o.   MRN: [id number] Subjective: Patient reports back soreness, some leg pain  Objective: Vital signs in last 24 hours: Temp:  [97.5 F (36.4 C)-97.9 F (36.6 C)] 97.9 F (36.6 C) (01/17 0400) Pulse Rate:  [61-79] 68 (01/17 0400) Resp:  [10-21] 20 (01/16 1426) BP: (94-141)/(53-89) 117/78 (01/17 0400) SpO2:  [96 %-100 %] 96 % (01/17 0400) Weight:  [101.7 kg] 101.7 kg (01/16 1019)  Intake/Output from previous day: 01/16 0701 - 01/17 0700 In: 2810 [P.O.:360; I.V.:2200; IV Piggyback:100] Out: 3625 [Urine:3600; Blood:25] Intake/Output this shift: No intake/output data recorded.  Neurologic: Grossly normal with stable weakness RLE (as expected), some ratchety breakaway component  Lab Results: Lab Results  Component Value Date   WBC 11.2 (H) 10/04/2018   HGB 14.7 10/04/2018   HCT 43.9 10/04/2018   MCV 87.1 10/04/2018   PLT 339 10/04/2018   No results found for: INR, PROTIME BMET Lab Results  Component Value Date   NA 141 10/04/2018   K 4.0 10/04/2018   CL 108 10/04/2018   CO2 23 10/04/2018   GLUCOSE 99 10/04/2018   BUN 11 10/04/2018   CREATININE 0.89 10/04/2018   CALCIUM 9.4 10/04/2018    Studies/Results: Dg Lumbar Spine 2-3 Views  Result Date: 10/07/2018 CLINICAL DATA:  Localization film. EXAM: LUMBAR SPINE - 2-3 VIEW COMPARISON:  None. FINDINGS: Initial film shows a needle at the pedicle level of L4. Second film shows tissue spreaders posterior with a probe directed at the L4-5 disc space. IMPRESSION: L4-5 localized. L4-5 localized. Electronically Signed   By: Paulina Fusi M.D.   On: 10/07/2018 12:51    Assessment/Plan: I suspect he will get better very slowly. He has been trying for disability for back pain for 4 years PRIOR to this issue and has been high dose oral oxy for quite a long time. Has breakaway weakness of R DF. PT/OT.   Estimated body mass index is 30.41 kg/m as calculated from the following:   Height as of this encounter: 6' (1.829 m).   Weight as of this encounter: 101.7 kg.    LOS: 4 days    Tia Alert 10/08/2018, 8:08 AM

## 2018-10-08 NOTE — Care Management Note (Addendum)
Case Management Note  Patient Details  Name: Shawn Hines MRN: [id number] Date of Birth: 07-22-1976  Subjective/Objective:  Pt admitted with back pain s/p RT L4-5 hemilaminectomy, medial facetectomy, foraminotomies followed by microdiscectomy L4-5 on the right utilizing microscopic dissection.  PTA, pt needs assistance with ADLS, lives with friends.                   Action/Plan: PT recommending HH follow up, DME.  Await post operative therapy evaluation for final recommendations.  Pt uninsured, but if HH/DME needed, will [facility].  Will need medication assistance upon dc with MATCH letter.   Expected Discharge Date:                  Expected Discharge Plan:  Home w Home Health Services  In-House Referral:     Discharge planning Services  CM Consult  Post Acute Care Choice:  Home Health Choice offered to:  Patient  DME Arranged:  3-N-1, Walker rolling DME Agency:  [facility]  [facility]  [facility]  Status of Service:  Completed, signed off  If discussed at Long Length of Stay Meetings, dates discussed:    Additional Comments:  10/08/18 J. Emmaclaire Switala, RN, BSN [facility]; [facility].  [facility].  RW and 3 in 1 to be delivered to pt prior to dc.  Please notify case manager if pt dc over the weekend for medication [facility].   Pt has been approved [facility] charity HH [facility]  Quintella BatonJulie W. Suman Trivedi, RN, BSN  Trauma/Neuro ICU Case Manager [phone]

## 2018-10-08 NOTE — Evaluation (Addendum)
Physical Therapy Re-Evaluation Patient Details Name: Shawn Hines MRN: [id number] DOB: 04-22-1976 Today's Date: 10/08/2018   History of Present Illness  43 y.o. male admitted on 10/04/18 for acute on chronic low back pain.  Pt with significant PMH of HTN, migraines, neuropathy, and gout.  Pt self reports seizure as well.  10/07/2018 Right L4-5 hemilaminectomy, medial facetectomy, foraminotomies followed by microdiscectomy L4-5 on the right   Clinical Impression  Pt is now post op microdiscectomy and remains very similar in presentation to before his surgery.  He continues to have R foot drop and right lower leg numbness.  He remains appropriate for home therapy as long as he can secure some help at discharge.      Follow Up Recommendations Home health PT;Supervision for mobility/OOB    Equipment Recommendations  Rolling walker with 5" wheels    Recommendations for Other Services   NA    Precautions / Restrictions Precautions Precautions: Back Precaution Booklet Issued: Yes (comment) Precaution Comments: handout given, pt reporting 3/3 precautions      Mobility  Bed Mobility Overal bed mobility: Needs Assistance Bed Mobility: Rolling;Sidelying to Sit Rolling: Supervision Sidelying to sit: Supervision       General bed mobility comments: Supervision to get EOB, heavily using bed rail for leverage.  Reports he has a hospital bed at his house.   Transfers Overall transfer level: Needs assistance Equipment used: Rolling walker (2 wheeled)   Sit to Stand: Min assist;From elevated surface         General transfer comment: Min assist to support trunk to stand from elevated bed over flexed knees, verbal cues for safe hand placement during transitions.   Ambulation/Gait Ambulation/Gait assistance: Min assist Gait Distance (Feet): 25 Feet Assistive device: Rolling walker (2 wheeled) Gait Pattern/deviations: Step-through pattern;Decreased dorsiflexion - right;Decreased  stance time - right;Decreased weight shift to right;Decreased step length - right;Steppage Gait velocity: decreased Gait velocity interpretation: <1.8 ft/sec, indicate of risk for recurrent falls General Gait Details: Pt with steppage gait pattern over a soft, flexed knee on  his right.  He has difficulty WB through his right leg both due to pain and decreased stability at his knee.       Balance Overall balance assessment: Needs assistance Sitting-balance support: Feet supported;No upper extremity supported Sitting balance-Leahy Scale: Fair     Standing balance support: Bilateral upper extremity supported Standing balance-Leahy Scale: Poor Standing balance comment: heavily reliant on bil UEs on RW in standing.                              Pertinent Vitals/Pain Pain Assessment: 0-10 Pain Score: 8  Pain Location: back, right leg/thigh Pain Descriptors / Indicators: Grimacing;Guarding;Operative site guarding Pain Intervention(s): Limited activity within patient's tolerance;Monitored during session;Repositioned    Home Living Family/patient expects to be discharged to:: Private residence Living Arrangements: Non-relatives/Friends Available Help at Discharge: Friend(s);Available PRN/intermittently Type of Home: House Home Access: Stairs to enter Entrance Stairs-Rails: None Entrance Stairs-Number of Steps: 2 Home Layout: One level Home Equipment: None      Prior Function Level of Independence: Needs assistance   Gait / Transfers Assistance Needed: patient reports furniture walking at home, needing assist in/out of the house   ADL's / Homemaking Assistance Needed: "making do" with ADLs but not needing any assist  Comments: Per pt report he is considering going home with his parents, but their home also has STE and no rails.  Hand Dominance   Dominant Hand: Right    Extremity/Trunk Assessment   Upper Extremity Assessment Upper Extremity Assessment: Defer  to OT evaluation    Lower Extremity Assessment Lower Extremity Assessment: RLE deficits/detail RLE Deficits / Details: right leg presentation is very similar to before surgery, except he reports that his numbness is now from his knee to his ankle (no longer his foot).  He continues to have weak knee/foot drag functional presentation. RLE Sensation: decreased light touch    Cervical / Trunk Assessment Cervical / Trunk Assessment: Other exceptions Cervical / Trunk Exceptions: now post op discectomy  Communication   Communication: No difficulties  Cognition Arousal/Alertness: Awake/alert Behavior During Therapy: WFL for tasks assessed/performed Overall Cognitive Status: Within Functional Limits for tasks assessed                                               Assessment/Plan    PT Assessment Patient needs continued PT services  PT Problem List Decreased strength;Decreased activity tolerance;Decreased balance;Decreased mobility;Decreased knowledge of use of DME;Decreased knowledge of precautions;Obesity;Impaired sensation;Pain       PT Treatment Interventions DME instruction;Gait training;Stair training;Functional mobility training;Therapeutic activities;Therapeutic exercise;Balance training;Patient/family education;Modalities    PT Goals (Current goals can be found in the Care Plan section)  Acute Rehab PT Goals Patient Stated Goal: to get back to his previous level of function before the right leg started bothering him PT Goal Formulation: With patient Time For Goal Achievement: 10/15/18 Potential to Achieve Goals: Good    Frequency Min 5X/week        AM-PAC PT "6 Clicks" Mobility  Outcome Measure Help needed turning from your back to your side while in a flat bed without using bedrails?: None Help needed moving from lying on your back to sitting on the side of a flat bed without using bedrails?: None Help needed moving to and from a bed to a chair  (including a wheelchair)?: A Little Help needed standing up from a chair using your arms (e.g., wheelchair or bedside chair)?: A Little Help needed to walk in hospital room?: A Little Help needed climbing 3-5 steps with a railing? : A Lot 6 Click Score: 19    End of Session Equipment Utilized During Treatment: Gait belt Activity Tolerance: Patient limited by pain Patient left: in chair;with call bell/phone within reach Nurse Communication: Mobility status PT Visit Diagnosis: Difficulty in walking, not elsewhere classified (R26.2);Muscle weakness (generalized) (M62.81);Pain Pain - Right/Left: Right Pain - part of body: Leg    Time: 1528-1600 PT Time Calculation (min) (ACUTE ONLY): 32 min   Charges:            Lurena Joiner B. Deanie Jupiter, PT, DPT  Acute Rehabilitation [phone] pager #[phone] office   PT Evaluation $PT Re-evaluation: 1 Re-eval PT Treatments $Gait Training: 8-22 mins        10/08/2018, 4:58 PM

## 2018-10-08 NOTE — Progress Notes (Signed)
   Subjective: [name] was seen and evaluated at bedside on morning rounds. He is POD1 from discectomy. He reports some burning in his right knee which is new. Endorses severe back pain with any movement which we explained is to be expected following surgery but should improve over the next 2 days. He has not had a BM since before surgery. Tolerating PO. No nausea or vomiting. Had some blurry vision that began yesterday before surgery, but has improved today.   Objective:  Vital signs in last 24 hours: Vitals:   10/07/18 2000 10/08/18 0000 10/08/18 0400 10/08/18 0825  BP: (!) 116/59 (!) 103/53 117/78 126/78  Pulse: 79 71 68 75  Resp:      Temp: 97.9 F (36.6 C) 97.9 F (36.6 C) 97.9 F (36.6 C) 97.7 F (36.5 C)  TempSrc: Oral Oral Oral Oral  SpO2:  96% 96% 93%  Weight:      Height:       General: awake, alert, sitting up in bed in NAD CV: RRR Abd: BS+; abdomen is soft, non-distended, non-tender   Assessment/Plan:  Principal Problem:   Lumbar herniated disc s/p  R L4-5 microdiscectomy Active Problems:   Hypertension   Chronic back pain   Long term current use of opiate analgesic   S/P lumbar laminectomy  1. Lumbar herniated disc s/cp R L4-L5 microdiscectomy  - POD1; complaining back pain but does note some improvement in RLE symptoms - current pain regimen is scheduled Oxy IR with PRN dilaudid for breakthrough - will continue bowel regimen  - PT/OT efforts   2. HTN - blood pressure remains stable on home meds  3. Migraines - on depakote 500 mg prn - denies current headache - endorses some blurry vision but suspect this was more related to hyperglycemia in the setting of high dose steroids prior to surgery   Dispo: Suspect patient will be slow to recover given his long-standing history of chronic back pain and high dose opiate use. Will follow his progression with PT and monitor pain control.   Lenward ChancellorBloomfield, Dailee Manalang D, DO 10/08/2018, 10:14 AM Pager:  [phone]

## 2018-10-08 NOTE — Progress Notes (Signed)
Occupational Therapy Treatment Patient Details Name: Shawn Hines MRN: [id number] DOB: 20-Sep-1976 Today's Date: 10/08/2018    History of present illness 43 y.o. male admitted on 10/04/18 for acute on chronic low back pain.  Pt with significant PMH of HTN, migraines, neuropathy, and gout.  Pt self reports seizure as well.  10/07/2018 Right L4-5 hemilaminectomy, medial facetectomy, foraminotomies followed by microdiscectomy L4-5 on the right    OT comments  This 43 yo male admitted and underwent above presents to acute OT with increased pain over initial eval due to surgery yesterday, he will continue to benefit from acute OT without need for follow up.  Follow Up Recommendations  No OT follow up;Supervision - Intermittent    Equipment Recommendations  3 in 1 bedside commode       Precautions / Restrictions Precautions Precautions: Back Precaution Comments: Pt able to tell me all 3 back precautions Restrictions Weight Bearing Restrictions: No       Mobility Bed Mobility Overal bed mobility: Needs Assistance Bed Mobility: Rolling;Sidelying to Sit Rolling: Supervision Sidelying to sit: Supervision       General bed mobility comments: HOB up and use of rail (pt reports he has a fully adjustable bed at home with rail)  Transfers Overall transfer level: Needs assistance Equipment used: Rolling walker (2 wheeled) Transfers: Sit to/from Stand Sit to Stand: Min assist;From elevated surface         General transfer comment: VCs for safe hand placement and increased time to transition RUE from bed rail to RW    Balance Overall balance assessment: Needs assistance Sitting-balance support: Feet supported;No upper extremity supported Sitting balance-Leahy Scale: Fair Sitting balance - Comments: Can sit without UE support but cannot accept challenge due to pain   Standing balance support: Bilateral upper extremity supported Standing balance-Leahy Scale: Poor Standing balance  comment: Heavy reliance on RW for support with addtional min support from therapist                           ADL either performed or assessed with clinical judgement   ADL Overall ADL's : Needs assistance/impaired                     Lower Body Dressing: Maximal assistance Lower Body Dressing Details (indicate cue type and reason): min A sit<>stand from raised bed (pt has height and head/leg adjustable bed at home per his report); pain from surgery is limiting factor today Toilet Transfer: Minimal assistance;Ambulation;RW Toilet Transfer Details (indicate cue type and reason): min A from raised bed>recliner 5 feet away                 Vision Patient Visual Report: No change from baseline            Cognition Arousal/Alertness: Awake/alert Behavior During Therapy: WFL for tasks assessed/performed Overall Cognitive Status: Within Functional Limits for tasks assessed                                                     Pertinent Vitals/ Pain       Pain Assessment: 0-10 Pain Score: 9  Pain Location: 9-back; 8-right thigh from muscle spasms Pain Descriptors / Indicators: Grimacing;Guarding;Operative site guarding Pain Intervention(s): Monitored during session;Repositioned         Frequency  Min 2X/week        Progress Toward Goals  OT Goals(current goals can now be found in the care plan section)  Progress towards OT goals: Not progressing toward goals - comment(new sx yesterday; goals still appropriate)     Plan Discharge plan remains appropriate       AM-PAC OT "6 Clicks" Daily Activity     Outcome Measure   Help from another person eating meals?: None Help from another person taking care of personal grooming?: A Little Help from another person toileting, which includes using toliet, bedpan, or urinal?: A Lot Help from another person bathing (including washing, rinsing, drying)?: A Lot Help from another person to  put on and taking off regular upper body clothing?: A Little Help from another person to put on and taking off regular lower body clothing?: A Lot 6 Click Score: 16    End of Session Equipment Utilized During Treatment: Gait belt;Rolling walker  OT Visit Diagnosis: Other abnormalities of gait and mobility (R26.89);Muscle weakness (generalized) (M62.81);Pain Pain - part of body: (incisional and right thigh)   Activity Tolerance Patient limited by pain   Patient Left in chair;with call bell/phone within reach           Time: 1151-1213 OT Time Calculation (min): 22 min  Charges: OT General Charges $OT Visit: 1 Visit OT Treatments $Self Care/Home Management : 8-22 mins  Shawn Hines, OTR/L Acute [employer] Pager [phone] Office [phone]     Evette Georges 10/08/2018, 12:47 PM

## 2018-10-09 LAB — BASIC METABOLIC PANEL
Anion gap: 14 (ref 5–15)
BUN: 24 mg/dL — ABNORMAL HIGH (ref 6–20)
CO2: 21 mmol/L — ABNORMAL LOW (ref 22–32)
Calcium: 9.4 mg/dL (ref 8.9–10.3)
Chloride: 100 mmol/L (ref 98–111)
Creatinine, Ser: 1.1 mg/dL (ref 0.61–1.24)
GFR calc Af Amer: >60 mL/min (ref >60)
Glucose, Bld: 135 mg/dL — ABNORMAL HIGH (ref 70–99)
Potassium: 4.1 mmol/L (ref 3.5–5.1)
Sodium: 135 mmol/L (ref 135–145)

## 2018-10-09 LAB — CBC
HCT: 43.7 % (ref 39.0–52.0)
Hemoglobin: 15.1 g/dL (ref 13.0–17.0)
MCH: 29.5 pg (ref 26.0–34.0)
MCHC: 34.6 g/dL (ref 30.0–36.0)
MCV: 85.4 fL (ref 80.0–100.0)
Platelets: 390 10*3/uL (ref 150–400)
RBC: 5.12 MIL/uL (ref 4.22–5.81)
RDW: 12.6 % (ref 11.5–15.5)
WBC: 20.8 10*3/uL — ABNORMAL HIGH (ref 4.0–10.5)
nRBC: 0 % (ref 0.0–0.2)

## 2018-10-09 LAB — GLUCOSE, CAPILLARY
Glucose-Capillary: 134 mg/dL — ABNORMAL HIGH (ref 70–99)
Glucose-Capillary: 256 mg/dL — ABNORMAL HIGH (ref 70–99)
Glucose-Capillary: 147 mg/dL — ABNORMAL HIGH (ref 70–99)
Glucose-Capillary: 194 mg/dL — ABNORMAL HIGH (ref 70–99)

## 2018-10-09 MED ORDER — DIVALPROEX SODIUM ER 500 MG PO TB24
500.0000 mg | ORAL_TABLET | Freq: Two times a day (BID) | ORAL | Status: DC | PRN
  Administered 2018-10-09 – 2018-10-11 (×4): 500 mg via ORAL
  Filled 2018-10-09 (×6): qty 1

## 2018-10-09 MED ORDER — POLYETHYLENE GLYCOL 3350 17 G PO PACK
17.0000 g | PACK | Freq: Every day | ORAL | Status: DC
  Filled 2018-10-09 (×2): qty 1

## 2018-10-09 MED ORDER — INSULIN ASPART 100 UNIT/ML ~~LOC~~ SOLN
0.0000 [IU] | Freq: Three times a day (TID) | SUBCUTANEOUS | Status: DC
  Administered 2018-10-09 – 2018-10-10 (×3): 3 [IU] via SUBCUTANEOUS
  Administered 2018-10-10: 4 [IU] via SUBCUTANEOUS
  Administered 2018-10-11: 3 [IU] via SUBCUTANEOUS

## 2018-10-09 MED ORDER — INSULIN ASPART 100 UNIT/ML ~~LOC~~ SOLN: 0.0000 [IU] | Freq: Every day | SUBCUTANEOUS | Status: DC

## 2018-10-09 NOTE — Progress Notes (Signed)
   Providing Compassionate, Quality Care - Together   Subjective: Patient reports pain and numbness down his right leg.  Objective: Vital signs in last 24 hours: Temp:  [97.6 F (36.4 C)-98 F (36.7 C)] 98 F (36.7 C) (01/18 0756) Pulse Rate:  [65-84] 72 (01/18 0756) Resp:  [18-20] 18 (01/18 0756) BP: (118-133)/(62-92) 133/92 (01/18 0756) SpO2:  [92 %-97 %] 92 % (01/18 0756)  Intake/Output from previous day: 01/17 0701 - 01/18 0700 In: 246 [P.O.:240; I.V.:6] Out: 500 [Urine:500] Intake/Output this shift: Total I/O In: 240 [P.O.:240] Out: 650 [Urine:650]  Alert and oriented x4 MAE RLE 3/5, right foot drop PERRLA CN II-XII grossly intact   Lab Results: Recent Labs    10/08/18 0723 10/09/18 0619  WBC 25.4* 20.8*  HGB 15.1 15.1  HCT 44.8 43.7  PLT 411* 390   BMET Recent Labs    10/08/18 0723 10/09/18 0619  NA 136 135  K 4.4 4.1  CL 102 100  CO2 22 21*  GLUCOSE 126* 135*  BUN 18 24*  CREATININE 1.10 1.10  CALCIUM 9.4 9.4    Studies/Results: Dg Lumbar Spine 2-3 Views  Result Date: 10/07/2018 CLINICAL DATA:  Localization film. EXAM: LUMBAR SPINE - 2-3 VIEW COMPARISON:  None. FINDINGS: Initial film shows a needle at the pedicle level of L4. Second film shows tissue spreaders posterior with a probe directed at the L4-5 disc space. IMPRESSION: L4-5 localized. L4-5 localized. Electronically Signed   By: Paulina Fusi M.D.   On: 10/07/2018 12:51    Assessment/Plan: Patient is two days s/p right L4-5 hemilaminectomy, medial facetectomy, foraminotomies followed by microdiscectomy L4-5 on the right. Pain control still an issue.   LOS: 5 days    -Continue mobilizing with therapies, recommending home health PT  Val Eagle, DNP, AGNP-C Nurse Practitioner  [facility] [address], [address], [place], [place] [address] P: [phone]    F: [phone]  10/09/2018, 11:02 AM

## 2018-10-09 NOTE — Progress Notes (Signed)
Occupational Therapy Treatment Patient Details Name: Shawn Hines MRN: [id number] DOB: December 01, 1975 Today's Date: 10/09/2018    History of present illness 43 y.o. male admitted on 10/04/18 for acute on chronic low back pain.  Pt with significant PMH of HTN, migraines, neuropathy, and gout.  Pt self reports seizure as well.  10/07/2018 Right L4-5 hemilaminectomy, medial facetectomy, foraminotomies followed by microdiscectomy L4-5 on the right    OT comments  The patient presented coming from the bathroom with RW. The patient reported they are still having numbness on the R knee to his ankle. The patient is requiring min guard and cues when ambulating with RW to be able to follow back precautions. The patient was able to report 3/3 precautions. The patient was educated about use of adaptive equipment (reacher, long handle sponge, sock aid and long handle shoe horn). At this time the patient reported they do not need equipment even though advised to have reacher for LE dressing. The patient reported they are going to following the hospital will be a walk in shower with bench and grab bar and they will have help from others. The patient would benefit from Acute Occupational Therapy services.    Follow Up Recommendations  No OT follow up;Supervision - Intermittent    Equipment Recommendations  3 in 1 bedside commode    Recommendations for Other Services      Precautions / Restrictions Precautions Precautions: Back Precaution Booklet Issued: No Precaution Comments: pt reporting 3/3 precautions Restrictions Weight Bearing Restrictions: No       Mobility Bed Mobility                  Transfers Overall transfer level: Needs assistance Equipment used: Rolling walker (2 wheeled) Transfers: Sit to/from Stand Sit to Stand: Min guard;From elevated surface              Balance Overall balance assessment: Needs assistance Sitting-balance support: Feet supported;No upper extremity  supported Sitting balance-Leahy Scale: Fair     Standing balance support: Bilateral upper extremity supported Standing balance-Leahy Scale: Poor Standing balance comment: heavily reliant on bil UEs on RW in standing.                            ADL either performed or assessed with clinical judgement   ADL Overall ADL's : Needs assistance/impaired Eating/Feeding: Sitting;Independent   Grooming: Wash/dry hands;Min guard;Cueing for sequencing   Upper Body Bathing: Sitting;Set up   Lower Body Bathing: Minimal assistance;Sit to/from stand Lower Body Bathing Details (indicate cue type and reason): decreased functional ROM and strength to R LE  Upper Body Dressing : Set up;Sitting   Lower Body Dressing: Moderate assistance;Cueing for sequencing;Cueing for compensatory techniques;Cueing for back precautions Lower Body Dressing Details (indicate cue type and reason): psotioning prior Toilet Transfer: Min guard;Ambulation;RW   Toileting- Architect and Hygiene: Min guard;Sit to/from stand       Functional mobility during ADLs: Min guard;Rolling walker;Cueing for sequencing General ADL Comments: patinet limited by pain, impaired safety awareness, and decreased activity tolerance     Vision   Vision Assessment?: No apparent visual deficits   Perception     Praxis      Cognition Arousal/Alertness: Awake/alert Behavior During Therapy: WFL for tasks assessed/performed Overall Cognitive Status: Within Functional Limits for tasks assessed  Exercises     Shoulder Instructions       General Comments increase time due to pain and decrease sensation to RLE    Pertinent Vitals/ Pain       Pain Assessment: 0-10 Pain Score: 8  Pain Location: back, right leg/thigh Pain Descriptors / Indicators: Aching;Guarding Pain Intervention(s): Limited activity within patient's tolerance;Monitored during  session;Repositioned;Premedicated before session;Relaxation  Home Living                                          Prior Functioning/Environment              Frequency  Min 2X/week        Progress Toward Goals  OT Goals(current goals can now be found in the care plan section)  Progress towards OT goals: Progressing toward goals  Acute Rehab OT Goals Patient Stated Goal: to get back to his previous level of function before the right leg started bothering him OT Goal Formulation: With patient Time For Goal Achievement: 10/19/18 Potential to Achieve Goals: Good  Plan Discharge plan remains appropriate    Co-evaluation                 AM-PAC OT "6 Clicks" Daily Activity     Outcome Measure   Help from another person eating meals?: None Help from another person taking care of personal grooming?: A Little Help from another person toileting, which includes using toliet, bedpan, or urinal?: A Little Help from another person bathing (including washing, rinsing, drying)?: A Lot Help from another person to put on and taking off regular upper body clothing?: A Little Help from another person to put on and taking off regular lower body clothing?: A Lot 6 Click Score: 17    End of Session Equipment Utilized During Treatment: Gait belt;Rolling walker  OT Visit Diagnosis: Other abnormalities of gait and mobility (R26.89);Muscle weakness (generalized) (M62.81);Pain Pain - Right/Left: Right Pain - part of body: Leg;Hip   Activity Tolerance Patient limited by pain   Patient Left in chair;with call bell/phone within reach   Nurse Communication          Time: 3790-2409 OT Time Calculation (min): 41 min  Charges: OT General Charges $OT Visit: 1 Visit OT Treatments $Self Care/Home Management : 38-52 mins  Alphia Moh OTR/L  Acute Rehab Services  [phone] office number [phone] pager number    Alphia Moh 10/09/2018, 12:11  PM

## 2018-10-09 NOTE — Progress Notes (Signed)
Physical Therapy Treatment Patient Details Name: Shawn Hines MRN: [id number] DOB: 12/30/1975 Today's Date: 10/09/2018    History of Present Illness 43 y.o. male admitted on 10/04/18 for acute on chronic low back pain.  Pt with significant PMH of HTN, migraines, neuropathy, and gout.  Pt self reports seizure as well.  10/07/2018 Right L4-5 hemilaminectomy, medial facetectomy, foraminotomies followed by microdiscectomy L4-5 on the right     PT Comments    Nicely improved gait stability.  Moderated step length, improved distance, completed stairs with a step to pattern.  Reinforced education to pt/ son.   Follow Up Recommendations  Home health PT;Supervision for mobility/OOB     Equipment Recommendations  Rolling walker with 5" wheels    Recommendations for Other Services       Precautions / Restrictions Precautions Precautions: Back Precaution Comments: pt reporting 3/3 precautions Restrictions Weight Bearing Restrictions: No    Mobility  Bed Mobility               General bed mobility comments: OOB  Transfers Overall transfer level: Needs assistance Equipment used: Rolling walker (2 wheeled) Transfers: Sit to/from Stand Sit to Stand: Min guard;From elevated surface(or with arms)            Ambulation/Gait Ambulation/Gait assistance: Min assist;Min guard Gait Distance (Feet): 60 Feet(50 feet after completing the stairs) Assistive device: Rolling walker (2 wheeled) Gait Pattern/deviations: Step-through pattern;Decreased dorsiflexion - right;Decreased stance time - right;Decreased weight shift to right;Decreased step length - right;Steppage   Gait velocity interpretation: 1.31 - 2.62 ft/sec, indicative of limited community ambulator General Gait Details: Pt with steppage gait pattern over a soft, flexed knee on  his right.  He has difficulty WB through his right leg both due to pain and decreased stability at his knee.    Stairs Stairs: Yes Stairs  assistance: Mod assist Stair Management: One rail Right;Step to pattern;Forwards Number of Stairs: 4 General stair comments: heavy use of rail became subjectively lighter each step, mode assist other side, step to.   Wheelchair Mobility    Modified Rankin (Stroke Patients Only)       Balance Overall balance assessment: Needs assistance Sitting-balance support: Feet supported;No upper extremity supported Sitting balance-Leahy Scale: Fair Sitting balance - Comments: Can sit without UE support but cannot accept challenge due to pain   Standing balance support: Bilateral upper extremity supported Standing balance-Leahy Scale: Poor Standing balance comment: heavily reliant on bil UEs on RW in standing.                             Cognition Arousal/Alertness: Awake/alert Behavior During Therapy: WFL for tasks assessed/performed Overall Cognitive Status: Within Functional Limits for tasks assessed                                        Exercises      General Comments General comments (skin integrity, edema, etc.): Extensive reinforcement on education, do's/don'ts advised.      Pertinent Vitals/Pain Pain Assessment: 0-10 Pain Score: 6  Pain Location: back, right leg/thigh Pain Descriptors / Indicators: Aching;Guarding Pain Intervention(s): Limited activity within patient's tolerance    Home Living                      Prior Function            PT  Goals (current goals can now be found in the care plan section) Acute Rehab PT Goals Patient Stated Goal: to get back to his previous level of function before the right leg started bothering him PT Goal Formulation: With patient Time For Goal Achievement: 10/15/18 Potential to Achieve Goals: Good Progress towards PT goals: Progressing toward goals    Frequency    Min 5X/week      PT Plan Current plan remains appropriate    Co-evaluation              AM-PAC PT "6 Clicks"  Mobility   Outcome Measure  Help needed turning from your back to your side while in a flat bed without using bedrails?: None Help needed moving from lying on your back to sitting on the side of a flat bed without using bedrails?: None Help needed moving to and from a bed to a chair (including a wheelchair)?: A Little Help needed standing up from a chair using your arms (e.g., wheelchair or bedside chair)?: A Little Help needed to walk in hospital room?: A Little Help needed climbing 3-5 steps with a railing? : A Lot 6 Click Score: 19    End of Session   Activity Tolerance: Patient tolerated treatment well;Patient limited by pain Patient left: in chair;with call bell/phone within reach Nurse Communication: Mobility status PT Visit Diagnosis: Difficulty in walking, not elsewhere classified (R26.2);Muscle weakness (generalized) (M62.81);Pain Pain - Right/Left: Right Pain - part of body: Leg     Time: 1730-1759 PT Time Calculation (min) (ACUTE ONLY): 29 min  Charges:  $Gait Training: 8-22 mins $Therapeutic Activity: 8-22 mins                     10/09/2018  [place] Bing, PT Acute Rehabilitation Services [phone]  (pager) [phone]  (office)   Shawn Hines 10/09/2018, 6:12 PM

## 2018-10-09 NOTE — Progress Notes (Signed)
   Subjective: [name] was seen and evaluated at bedside. No acute events overnight. He complains of a migraine headache this morning which feels like his typical migraine with associated nausea and photophobia. He notes some mild improvement in his low back pain, but still having significant pain, numbness/tingling, and weakness in his right leg.   Objective:  Vital signs in last 24 hours: Vitals:   10/08/18 2300 10/09/18 0300 10/09/18 0756 10/09/18 1113  BP: 126/88 129/87 (!) 133/92 122/79  Pulse: 76 67 72 67  Resp:   18 18  Temp: 97.8 F (36.6 C) 98 F (36.7 C) 98 F (36.7 C) 98.7 F (37.1 C)  TempSrc: Oral Oral Oral Oral  SpO2: 97% 95% 92% 95%  Weight:      Height:       General: awake, alert, lying in bed in NAD CV: RRR Neuro: RLE 3/5 gross motor strength; absent patellar reflex   Assessment/Plan:  Principal Problem:   Lumbar herniated disc s/p  R L4-5 microdiscectomy Active Problems:   Hypertension   Chronic back pain   Long term current use of opiate analgesic   S/P lumbar laminectomy  1. Lumbar herniated disc s/cp R L4-L5 microdiscectomy  - POD2; low back pain has improved; there has not been much change in his right lower extremity symptoms; this may take some time to resolve and will need continued PT efforts - continue current pain regimen, Oxy IR 15 mg q 4; Dilaudid q 2 PRN for breakthrough  - scheduled bowel regimen  2. HTN - blood pressure remains stable on home meds  3. Migraines - complains of migraine headache this morning; have reordered home Depakote  Dispo: Anticipated discharge pending post-op pain control and PT progression.     Lenward ChancellorBloomfield, Sriansh Farra D, DO 10/09/2018, 12:33 PM Pager: [phone]

## 2018-10-10 DIAGNOSIS — M25519 Pain in unspecified shoulder: Secondary | ICD-10-CM

## 2018-10-10 LAB — BASIC METABOLIC PANEL
Anion gap: 14 (ref 5–15)
BUN: 27 mg/dL — ABNORMAL HIGH (ref 6–20)
CO2: 22 mmol/L (ref 22–32)
Calcium: 9.5 mg/dL (ref 8.9–10.3)
Chloride: 96 mmol/L — ABNORMAL LOW (ref 98–111)
Creatinine, Ser: 1.11 mg/dL (ref 0.61–1.24)
GFR calc Af Amer: >60 mL/min (ref >60)
GFR calc non Af Amer: >60 mL/min (ref >60)
Glucose, Bld: 125 mg/dL — ABNORMAL HIGH (ref 70–99)
Potassium: 4.2 mmol/L (ref 3.5–5.1)
SODIUM: 132 mmol/L — AB (ref 135–145)

## 2018-10-10 LAB — GLUCOSE, CAPILLARY
GLUCOSE-CAPILLARY: 133 mg/dL — AB (ref 70–99)
Glucose-Capillary: 197 mg/dL — ABNORMAL HIGH (ref 70–99)
Glucose-Capillary: 141 mg/dL — ABNORMAL HIGH (ref 70–99)
Glucose-Capillary: 135 mg/dL — ABNORMAL HIGH (ref 70–99)

## 2018-10-10 LAB — CBC
HCT: 45.8 % (ref 39.0–52.0)
Hemoglobin: 15.7 g/dL (ref 13.0–17.0)
MCH: 29.4 pg (ref 26.0–34.0)
MCHC: 34.3 g/dL (ref 30.0–36.0)
MCV: 85.8 fL (ref 80.0–100.0)
Platelets: 446 10*3/uL — ABNORMAL HIGH (ref 150–400)
RBC: 5.34 MIL/uL (ref 4.22–5.81)
RDW: 12.6 % (ref 11.5–15.5)
WBC: 20.4 10*3/uL — ABNORMAL HIGH (ref 4.0–10.5)
nRBC: 0 % (ref 0.0–0.2)

## 2018-10-10 LAB — HEMOGLOBIN A1C
Hgb A1c MFr Bld: 5.6 % (ref 4.8–5.6)
MEAN PLASMA GLUCOSE: 114.02 mg/dL

## 2018-10-10 MED ORDER — HYDROMORPHONE HCL 1 MG/ML IJ SOLN
1.0000 mg | INTRAMUSCULAR | Status: DC | PRN
  Administered 2018-10-10: 1 mg via INTRAVENOUS
  Filled 2018-10-10: qty 1

## 2018-10-10 MED ORDER — MAGNESIUM CITRATE PO SOLN: 1.0000 | ORAL | Status: DC | PRN

## 2018-10-10 MED ORDER — MORPHINE SULFATE ER 15 MG PO TBCR
15.0000 mg | EXTENDED_RELEASE_TABLET | Freq: Two times a day (BID) | ORAL | Status: DC
  Administered 2018-10-10 – 2018-10-11 (×3): 15 mg via ORAL
  Filled 2018-10-10 (×3): qty 1

## 2018-10-10 NOTE — Progress Notes (Addendum)
   Providing Compassionate, Quality Care - Together   Subjective: Patient reports pain and numbness down his right leg. He doesn't feel the oxycodone is helping his pain. He wants to get off the dilaudid, but doesn't feel anything else providess the same relief. Patient reports blurry vision and is concerned he might be diabetic as "it runs in his family." He is requesting an A1C be drawn.  Objective: Vital signs in last 24 hours: Temp:  [97.8 F (36.6 C)-98.7 F (37.1 C)] 98.2 F (36.8 C) (01/19 0800) Pulse Rate:  [63-84] 73 (01/19 0800) Resp:  [16-20] 20 (01/19 0800) BP: (121-130)/(75-99) 125/99 (01/19 0800) SpO2:  [95 %-99 %] 99 % (01/19 0800)  Intake/Output from previous day: 01/18 0701 - 01/19 0700 In: 720 [P.O.:720] Out: 650 [Urine:650] Intake/Output this shift: Total I/O In: 240 [P.O.:240] Out: -   Alert and oriented x4 MAE RLE 3/5, right foot drop PERRLA CN II-XII grossly intact  Lab Results: Recent Labs    10/09/18 0619 10/10/18 0623  WBC 20.8* 20.4*  HGB 15.1 15.7  HCT 43.7 45.8  PLT 390 446*   BMET Recent Labs    10/09/18 0619 10/10/18 0623  NA 135 132*  K 4.1 4.2  CL 100 96*  CO2 21* 22  GLUCOSE 135* 125*  BUN 24* 27*  CREATININE 1.10 1.11  CALCIUM 9.4 9.5    Studies/Results: No results found.  Assessment/Plan: Patient is three days s/p right L4-5 hemilaminectomy, medial facetectomy, foraminotomies followed by microdiscectomy L4-5 on the right. Pain control still an issue.   LOS: 6 days    -Continue mobilizing with therapies, recommending home health PT -Ordered A1C. Consult diabetes coordinator if elevated. -Adding MS Contin 15 mg BID and discontinuing dilaudid.  Val Eagle, DNP, AGNP-C Nurse Practitioner  [facility] [address], [address], [place], [place] [address] P: [phone]    F: [phone]  10/10/2018, 11:02 AM

## 2018-10-10 NOTE — Progress Notes (Signed)
   Subjective:  No acute events overnight. Continue to complain of lower back pain as well as shoulder pain.  Discussed this is expected after back surgery and we will continue to treat. Current pain regimen eases of the pain. Has been able to work with PT. Went up and down the stairs yesterday. Having good PO intake and had 2 BMs yesterday.   Objective:  Vital signs in last 24 hours: Vitals:   10/09/18 2022 10/09/18 2300 10/10/18 0500 10/10/18 0800  BP: 121/77 126/84 126/75 (!) 125/99  Pulse: 68 63 84 73  Resp:   20 20  Temp: 98.1 F (36.7 C) 98.2 F (36.8 C) 97.8 F (36.6 C) 98.2 F (36.8 C)  TempSrc: Oral Oral Oral Oral  SpO2: 96% 96% 95% 99%  Weight:      Height:       General: obese male, in pain but in NAD  Neuro: 2-3/5 strength in RLE as well as decreased sensation, patellar reflex remains absent, no deficits on LLE    Assessment/Plan:  Principal Problem:   Lumbar herniated disc s/p  R L4-5 microdiscectomy Active Problems:   Hypertension   Chronic back pain   Long term current use of opiate analgesic   S/P lumbar laminectomy   # R Lumbar herniated disc s/cp R L4-L5 microdiscectomy: POD3 and doing well. He was more active yesterday and complains of worsening pain today. Discussed this is expected. Weakness and numbness slowly improving on RLE. Discussed will take more time to resolve, but he may not regain full function of his RLE. We will start a slow wean of Dilaudid today q2h-> q3h PRN. Continue working with PT.  - Oxy IR 15 mg q4h; Dilaudid q3h PRN for breakthrough  - Scheduled bowel regimen  # HTN: blood pressure remains stable on home meds  # Migraines: well controlled on hoem Depakote 500 mg BID PRN   Dispo: Anticipated discharge pending post-op pain control and PT progression.     Burna Cash, MD 10/10/2018, 10:20 AM Pager: [phone]

## 2018-10-11 DIAGNOSIS — Z888 Allergy status to other drugs, medicaments and biological substances status: Secondary | ICD-10-CM

## 2018-10-11 DIAGNOSIS — Z886 Allergy status to analgesic agent status: Secondary | ICD-10-CM

## 2018-10-11 LAB — BASIC METABOLIC PANEL
Anion gap: 17 — ABNORMAL HIGH (ref 5–15)
BUN: 33 mg/dL — ABNORMAL HIGH (ref 6–20)
CO2: 19 mmol/L — ABNORMAL LOW (ref 22–32)
Calcium: 9.4 mg/dL (ref 8.9–10.3)
Chloride: 97 mmol/L — ABNORMAL LOW (ref 98–111)
Creatinine, Ser: 1.25 mg/dL — ABNORMAL HIGH (ref 0.61–1.24)
GFR calc Af Amer: >60 mL/min (ref >60)
GFR calc non Af Amer: >60 mL/min (ref >60)
Glucose, Bld: 148 mg/dL — ABNORMAL HIGH (ref 70–99)
Potassium: 4 mmol/L (ref 3.5–5.1)
Sodium: 133 mmol/L — ABNORMAL LOW (ref 135–145)

## 2018-10-11 LAB — GLUCOSE, CAPILLARY
Glucose-Capillary: 121 mg/dL — ABNORMAL HIGH (ref 70–99)
Glucose-Capillary: 105 mg/dL — ABNORMAL HIGH (ref 70–99)

## 2018-10-11 MED ORDER — SENNOSIDES-DOCUSATE SODIUM 8.6-50 MG PO TABS: 2.0000 | ORAL_TABLET | Freq: Two times a day (BID) | ORAL | 0 refills | Status: AC

## 2018-10-11 MED ORDER — POLYETHYLENE GLYCOL 3350 17 G PO PACK: 17.0000 g | PACK | Freq: Every day | ORAL | 0 refills | Status: AC

## 2018-10-11 MED ORDER — NALOXONE HCL 0.4 MG/ML IJ SOLN: 0.4000 mg | INTRAMUSCULAR | 0 refills | Status: AC | PRN

## 2018-10-11 MED ORDER — MORPHINE SULFATE ER 15 MG PO TBCR: 15.0000 mg | EXTENDED_RELEASE_TABLET | Freq: Two times a day (BID) | ORAL | 0 refills | Status: AC

## 2018-10-11 NOTE — Care Management Note (Signed)
Case Management Note  Patient Details  Name: Shawn Hines MRN: [id number] Date of Birth: 01/15/1976  Subjective/Objective:  Pt admitted with back pain s/p RT L4-5 hemilaminectomy, medial facetectomy, foraminotomies followed by microdiscectomy L4-5 on the right utilizing microscopic dissection.  PTA, pt needs assistance with ADLS, lives with friends.                   Action/Plan: PT recommending HH follow up, DME.  Await post operative therapy evaluation for final recommendations.  Pt uninsured, but if HH/DME needed, will evaluate for HH/DME assistance by [facility] charity program.  Will need medication assistance upon dc with MATCH letter.   Expected Discharge Date:  10/11/18               Expected Discharge Plan:  Home w Home Health Services  In-House Referral:     Discharge planning Services  CM Consult, [facility] Program  Post Acute Care Choice:  Home Health Choice offered to:  Patient  DME Arranged:  3-N-1, Walker rolling DME Agency:  [facility]  HH Arranged:  PT [facility] Agency:  [facility]  Status of Service:  Completed, signed off  If discussed at Long Length of Stay Meetings, dates discussed:    Additional Comments:  10/08/18 J. Nishtha Raider, RN, BSN Pt agreeable to [facility] follow up and DME; referral to [facility] for charity [facility] care.  Referral to [facility] to evaluate for charity DME.  RW and 3 in 1 to be delivered to pt prior to dc.  Please notify case manager if pt dc over the weekend for medication assistance with [facility] letter.   Pt has been approved for charity HH with [facility] and DME through [facility].  10/11/17 J. Aylani Spurlock, RN, BSN Pt medically stable for dc home today; all DME has been delivered to bedside.  MATCH letter given with explanation of program benefits.  [facility] notified of dc home today.  Quintella Baton, RN, BSN  Trauma/Neuro ICU Case Manager [phone]

## 2018-10-11 NOTE — Progress Notes (Signed)
   Subjective: His pain level is a 7/10. He has been working with PT and has made some progress. He says that he is getting pains in his LLE and muscle spasms in his RLE. Robaxin is helping his muscle spasms. He has also had intermittent blurry vision since before surgery.   Objective:  Vital signs in last 24 hours: Vitals:   10/10/18 0800 10/10/18 1141 10/10/18 1506 10/10/18 1900  BP: (!) 125/99 123/75 123/79 118/83  Pulse: 73 66 65 84  Resp: 20 20 18    Temp: 98.2 F (36.8 C) 98 F (36.7 C) 97.8 F (36.6 C) 97.9 F (36.6 C)  TempSrc: Oral Oral Axillary Oral  SpO2: 99% 98% 97% 97%  Weight:      Height:       General: Lying in bed in no acute distress Neuro: 3-4/5 strength RLE (flexion and extension of knee and ankle), 5/5 strength LLE, Absent right patellar reflex, left patellar reflex present, EOMI, PERRL, visual fields in tact  Assessment/Plan:  Principal Problem:   Lumbar herniated disc s/p  R L4-5 microdiscectomy Active Problems:   Hypertension   Chronic back pain   Long term current use of opiate analgesic   S/P lumbar laminectomy  1. Lumbar herniated disc s/cp R L4-L5 microdiscectomy  - POD4; low back pain has improved; on exam his RLE strength has improved from 2 day sago; will continue PT efforts outpatient  - Appreciate neurosurgery following; have transitioned to MS Contin for long-acting pain control which I will prescribe for 7 days for post-op pain; he will follow-up with them in 2 weeks  2. HTN - blood pressure remains stable on home meds  3. Migraines - well controlled on home Depakote  4. Blurred vision - patient's visual fields and EOMI are intact on exam; he is able to see number of fingers in front of him - doesn't appear to be hyperglycemic related in the setting of steroids; A1C normal  - may need ophtho eval outpatient    Dispo: Patient is medically stable for discharge home today.   Lenward Chancellor D, DO 10/11/2018, 6:13 AM Pager:  [phone]

## 2018-10-11 NOTE — Discharge Summary (Signed)
Name: Shawn Hines MRN: [id number] DOB: 02-05-1976 43 y.o. PCP: Jearld Lesch, MD  Date of Admission: 10/04/2018 12:19 PM Date of Discharge: 10/11/2018 Attending Physician: No att. providers found  Discharge Diagnosis: 1. Lumbar herniated disc s/p L4-L5 microdiscectomy    Discharge Medications: Allergies as of 10/11/2018      Reactions   Ibuprofen Other (See Comments)   Upper GI bleed from 800 mg tablets - 2017   Imitrex [sumatriptan] Other (See Comments)   Caused rebound headaches      Medication List    STOP taking these medications   predniSONE 10 MG tablet Commonly known as:  DELTASONE     TAKE these medications   amLODipine 10 MG tablet Commonly known as:  NORVASC Take 10 mg by mouth at bedtime.   atenolol-chlorthalidone 50-25 MG tablet Commonly known as:  TENORETIC Take 1 tablet by mouth at bedtime.   divalproex 500 MG 24 hr tablet Commonly known as:  DEPAKOTE ER Take 500 mg by mouth 2 (two) times daily as needed (at onset of migraine headache).   gabapentin 400 MG capsule Commonly known as:  NEURONTIN Take 400 mg by mouth 3 (three) times daily.   indomethacin 25 MG capsule Commonly known as:  INDOCIN Take 25 mg by mouth daily as needed (gout).   methocarbamol 750 MG tablet Commonly known as:  ROBAXIN Take 750 mg by mouth 2 (two) times daily. What changed:  Another medication with the same name was removed. Continue taking this medication, and follow the directions you see here.   morphine 15 MG 12 hr tablet Commonly known as:  MS CONTIN Take 1 tablet (15 mg total) by mouth every 12 (twelve) hours for 7 days.   naloxone 0.4 MG/ML injection Commonly known as:  NARCAN Inject 1 mL (0.4 mg total) into the vein as needed (respiratory depression).   oxyCODONE 15 MG immediate release tablet Commonly known as:  ROXICODONE Take 15 mg by mouth 4 (four) times daily.   polyethylene glycol packet Commonly known as:  MIRALAX / GLYCOLAX Take 17 g by  mouth daily.   senna-docusate 8.6-50 MG tablet Commonly known as:  Senokot-S Take 2 tablets by mouth 2 (two) times daily.       Disposition and follow-up:   Mr.Shawn Hines was discharged from [facility] in Good condition.  At the hospital follow up visit please address:  1. Lumbar herniated disc s/p L4-L5 microdiscectomy: Evaluate how he is progressing with PT. He had 4/5 strength in RLE which was improvement post-op. Continued to have absent patellar reflex. Ensure he has follow-up with neurosurgery.   2.  Labs / imaging needed at time of follow-up: none  3.  Pending labs/ test needing follow-up: none   Follow-up Appointments: Follow-up Information    [facility] Follow up.           Hospital Course by problem list: 1. Lumbar herniated disc s/p L4-L5 microdiscectomy: [name] presented with acute worsening of his chronic low back pain as well as significant pain and weakness in his right leg. MRI revealed herniated disc at L4-L5. Neurosurgery consulted. He had more findings on physical exam than what would expect on MRI findings, but given significant symptoms proceeded with L4-L5 microdiscectomy. He had some improvement in strength postoperatively, but had persistent absent patellar reflex. He progressed well with PT, and he was recommended for home health services outpatient. His post-op pain was managed with long acting morphineContin which was prescribed for  an additional week's course at discharge. He was also continued on his home Oxycodone 15 mg QID. He was stable for discharge on POD4 with planned neurosurgery follow-up in 2 weeks.    Discharge Vitals:   BP 124/81 (BP Location: Left Arm)   Pulse 62   Temp 98.1 F (36.7 C)   Resp 16   Ht 6' (1.829 m)   Wt 101.7 kg   SpO2 97%   BMI 30.41 kg/m   Pertinent Labs, Studies, and Procedures:  None   Discharge Instructions: Discharge Instructions    Diet - low sodium heart healthy    Complete by:  As directed    Discharge instructions   Complete by:  As directed    [name], I am glad you were able to get back surgery to hopefully address the pain in your back and symptoms in your leg. I am hopeful these will continue to improve with PT. I have placed the order for you to receive PT at your home like the therapists recommended and have also ordered a walker for you to be able to use at home.  I have continued your Oxycodone as prescribed, as well as a short course of longer acting pain medication while you recover from surgery.  You will follow-up with neurosurgery in 2 weeks to see how you are doing. I'd also encourage you to make an appointment with your primary care doctor so he can continue to follow your progress as well. I wish you the best as you continue to recover.  Dr. Chesley Mires   Increase activity slowly   Complete by:  As directed       Signed: Bridget Hartshorn, DO 10/14/2018, 11:26 AM   Pager: [phone]

## 2018-10-11 NOTE — Progress Notes (Signed)
Patient d/c orders given, no printed prescriptions to give. Equipment delivered. IV removed. Patient taken to car via wheelchair with all belongings.

## 2018-10-11 NOTE — Progress Notes (Signed)
Subjective: Patient reports pain is a 7/10, still report NT and right foot but thinks strength is improving. Still reports pain in his right leg with muscle cramps  Objective: Vital signs in last 24 hours: Temp:  [97.8 F (36.6 C)-98.1 F (36.7 C)] 98.1 F (36.7 C) (01/20 0740) Pulse Rate:  [65-84] 69 (01/20 0740) Resp:  [18-20] 18 (01/20 0740) BP: (118-134)/(75-96) 134/96 (01/20 0740) SpO2:  [97 %-98 %] 97 % (01/20 0740)  Intake/Output from previous day: 01/19 0701 - 01/20 0700 In: 843 [P.O.:840; I.V.:3] Out: 750 [Urine:750] Intake/Output this shift: No intake/output data recorded.   Lab Results: Lab Results  Component Value Date   WBC 20.4 (H) 10/10/2018   HGB 15.7 10/10/2018   HCT 45.8 10/10/2018   MCV 85.8 10/10/2018   PLT 446 (H) 10/10/2018   No results found for: INR, PROTIME BMET Lab Results  Component Value Date   NA 133 (L) 10/11/2018   K 4.0 10/11/2018   CL 97 (L) 10/11/2018   CO2 19 (L) 10/11/2018   GLUCOSE 148 (H) 10/11/2018   BUN 33 (H) 10/11/2018   CREATININE 1.25 (H) 10/11/2018   CALCIUM 9.4 10/11/2018    Studies/Results: No results found.  Assessment/Plan: Postop day 4 from microdiskectomy. Ok to d/c from nsgy standpoint. Will see him in the office in 2 weeks.    LOS: 7 days    Tiana Loft Toua Stites 10/11/2018, 8:06 AM

## 2020-01-19 IMAGING — CR DG LUMBAR SPINE COMPLETE 4+V
5 series · 5 of 5 positions shown · non-contrast
Comparison: None.

CLINICAL DATA: Patient complains of lower back pain radiating down
legs x 1 week. States that he has old injury and thinks flared up,
denies recent trauma. Pt writhing and hollering in pain.

EXAM:
LUMBAR SPINE - COMPLETE 4+ VIEW

[l-spine ap]
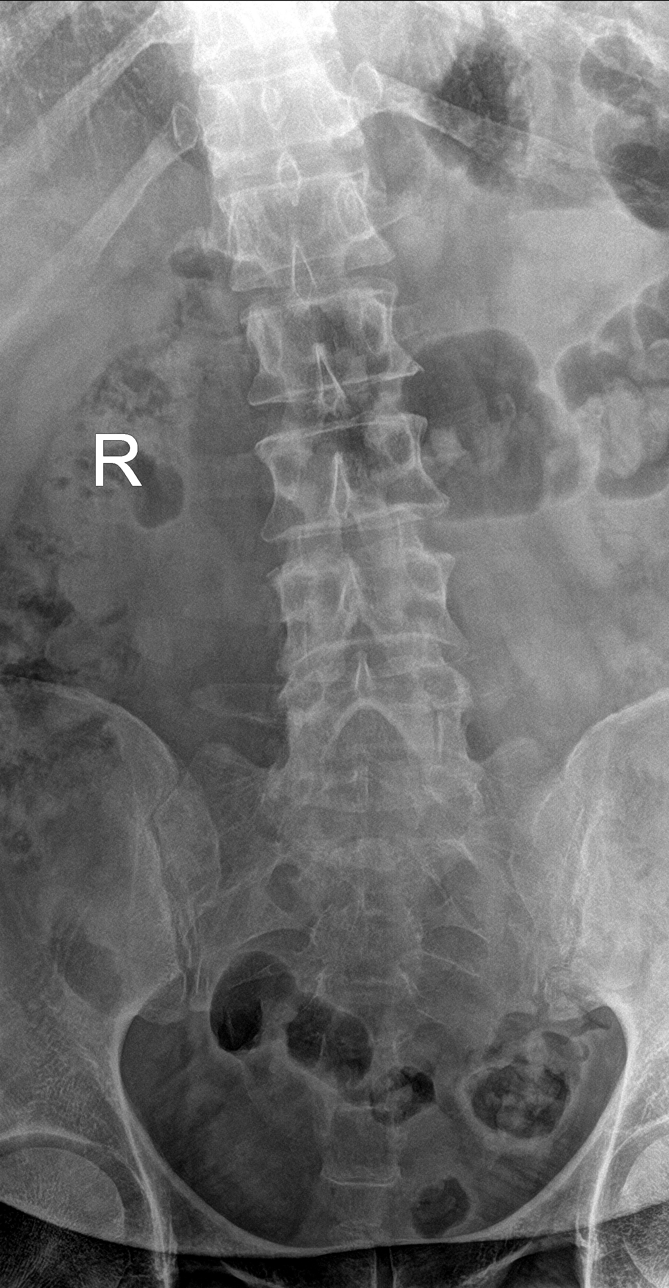

[l-spine obl (1 of 2)]
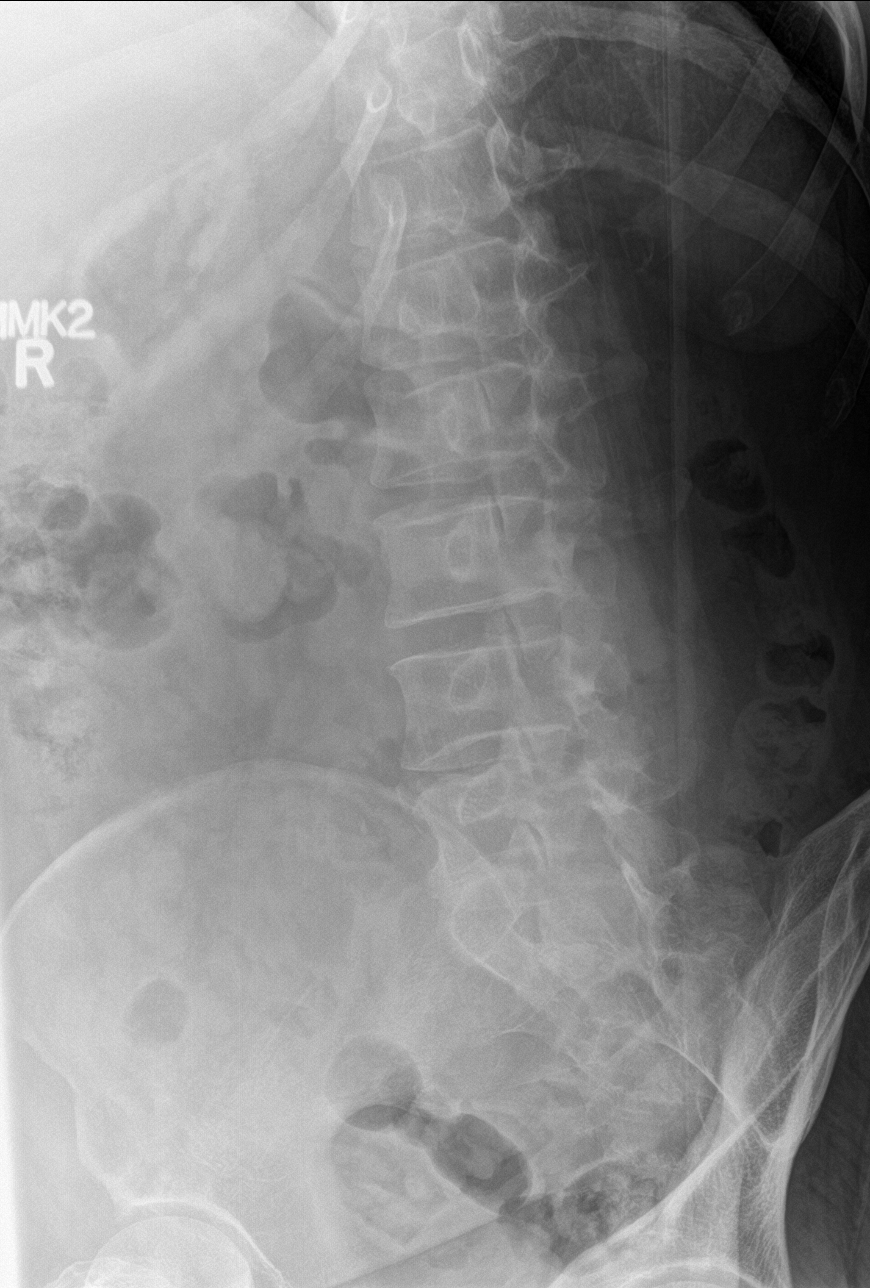

[l-spine obl (2 of 2)]
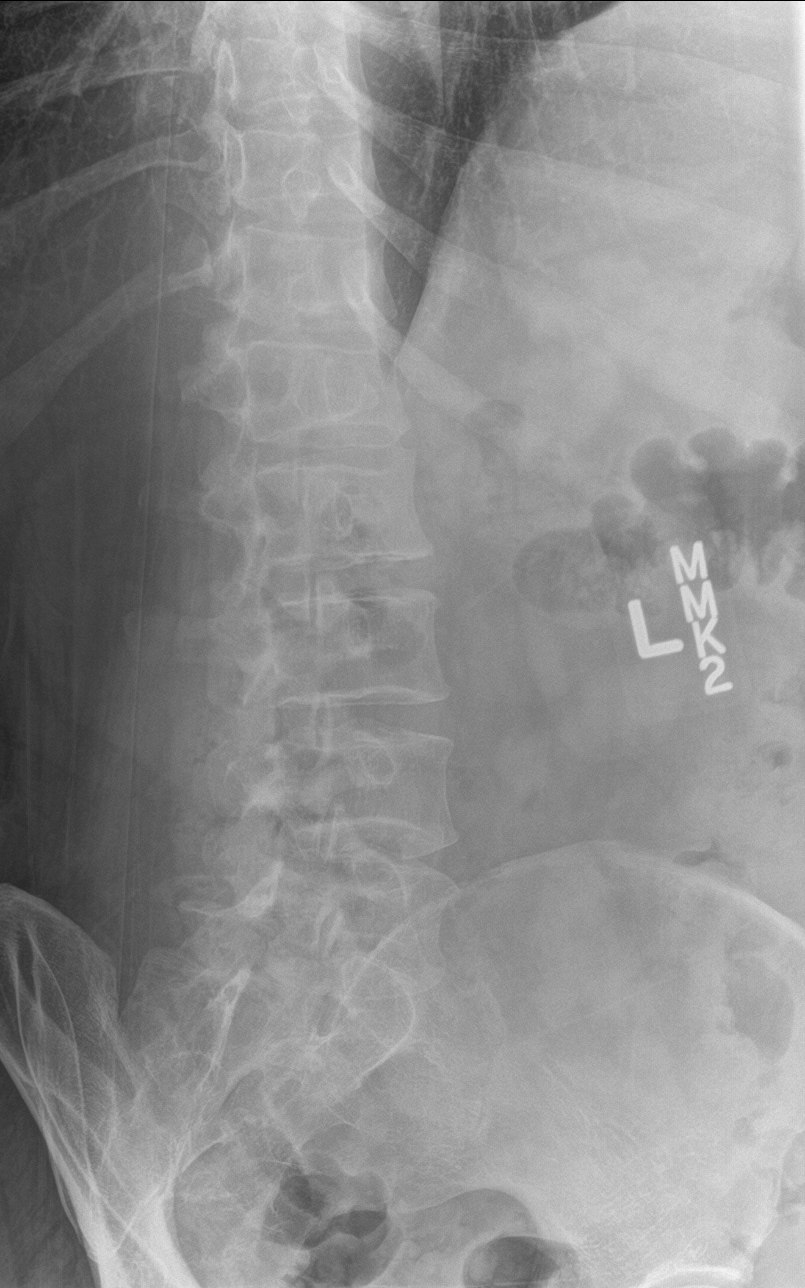

[l-spine lat]
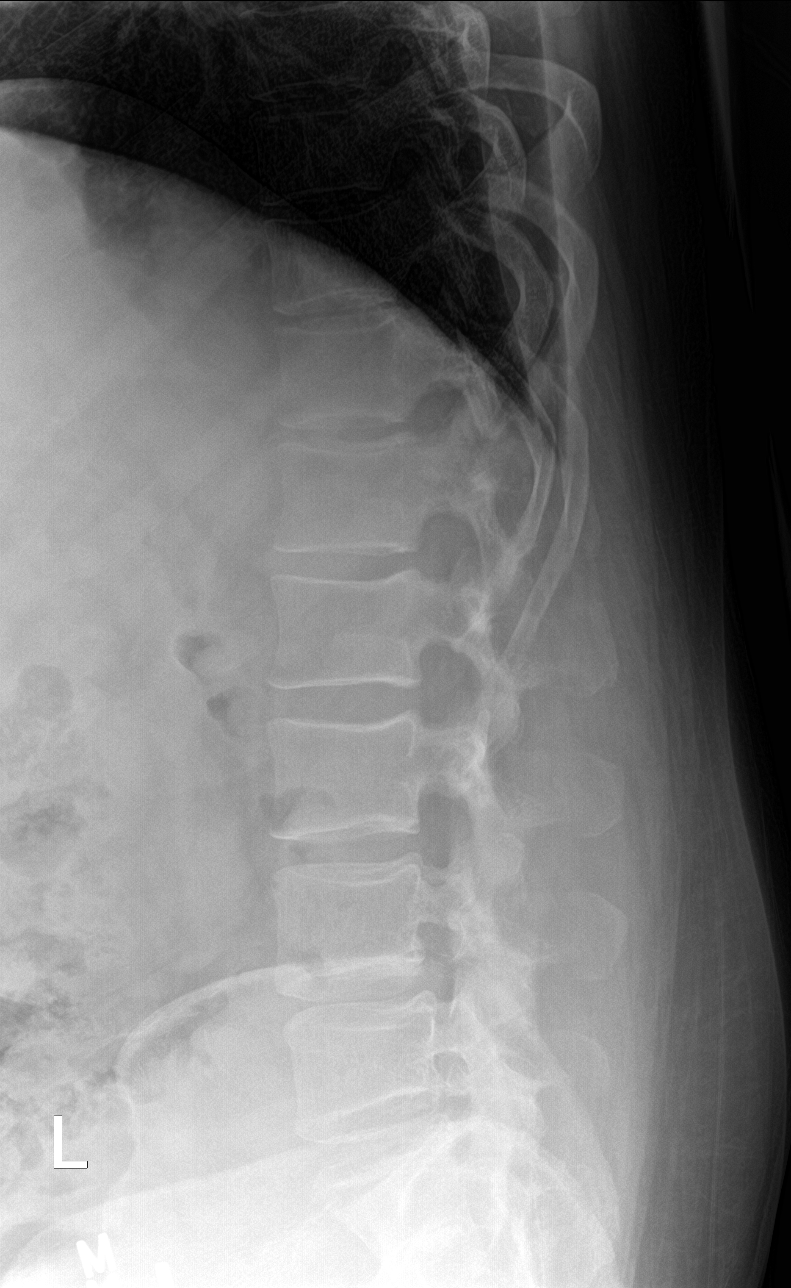

[l-spine spot]
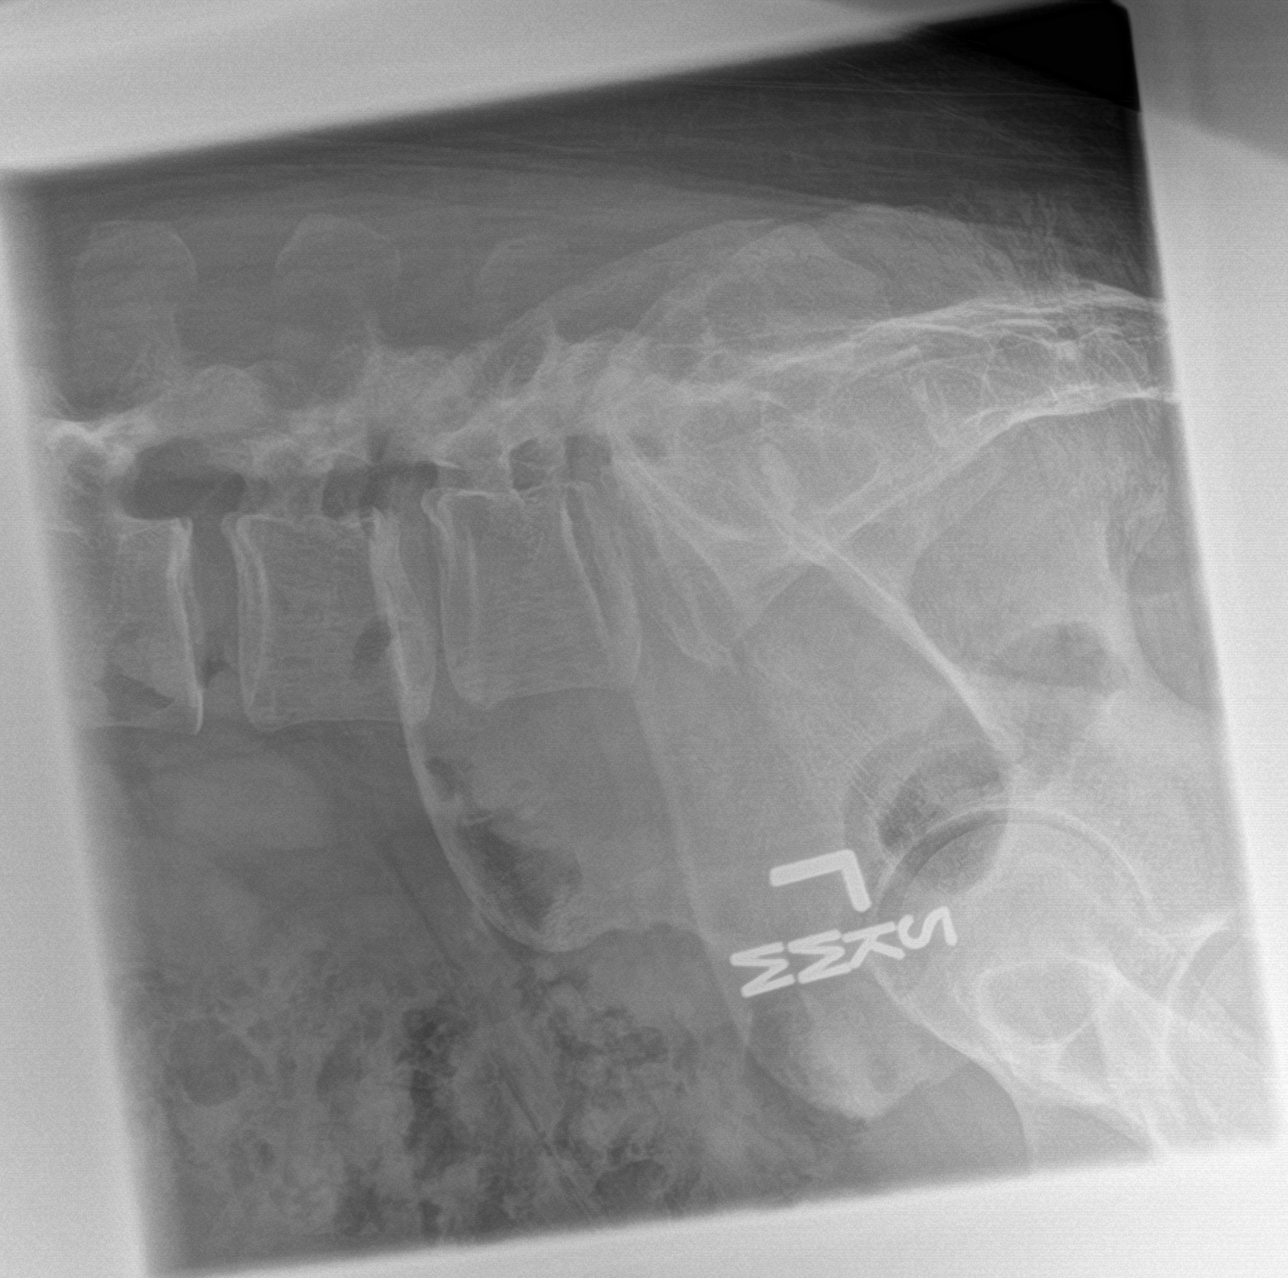

[5 of 5 positions shown; findings below may reference images not displayed]

FINDINGS: No fracture or bone lesion.

Slight anterolisthesis of L4 on L5. No other spondylolisthesis.
There is straightening of the normal lumbar lordosis.

Mild loss of disc height at L4-L5. Remaining discs are well
preserved in height. Facet joints are well preserved.

Soft tissues are unremarkable.
IMPRESSION: 1. No fracture or acute finding.
2. Straightened lumbar lordosis. Mild disc degenerative changes at
L4-L5 with a slight anterolisthesis.

## 2020-01-31 IMAGING — CR DG LUMBAR SPINE 2-3V
2 series · 2 of 2 positions shown · non-contrast
Comparison: None.

CLINICAL DATA: Localization film.

EXAM:
LUMBAR SPINE - 2-3 VIEW

[lateral (1 of 2)]
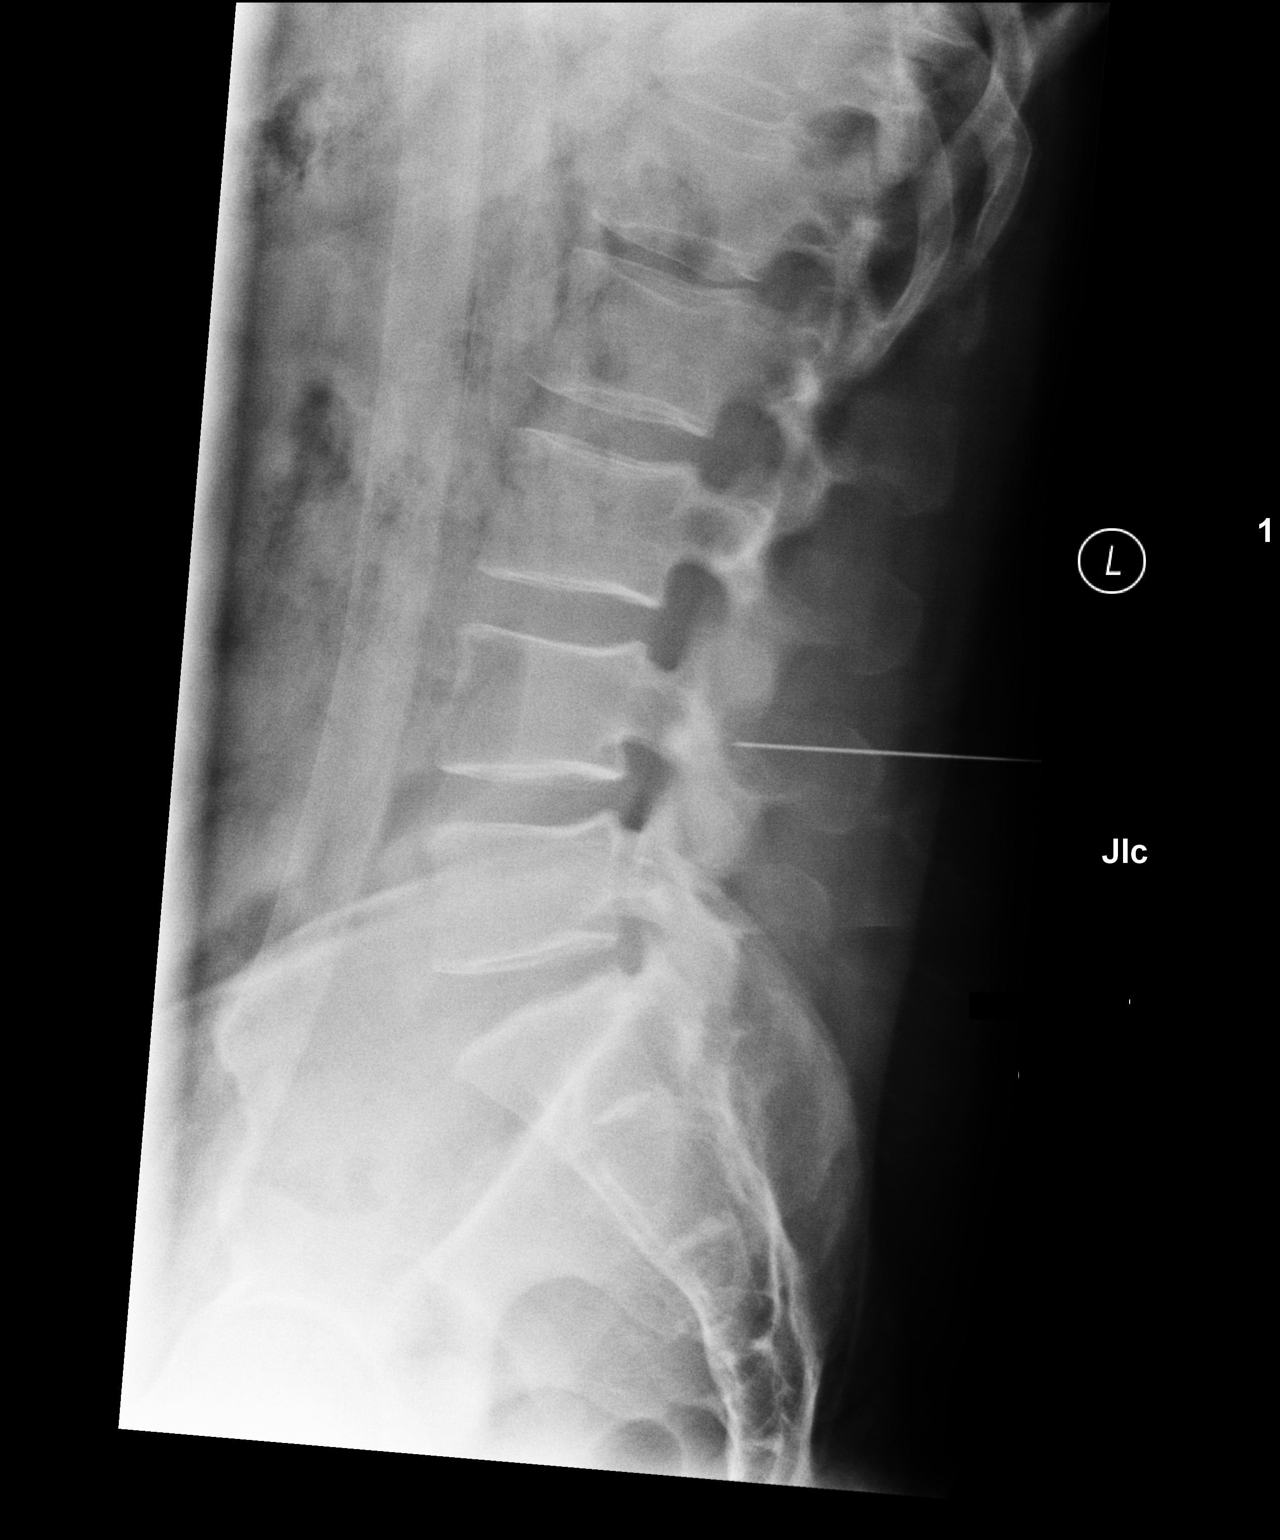

[lateral (2 of 2)]
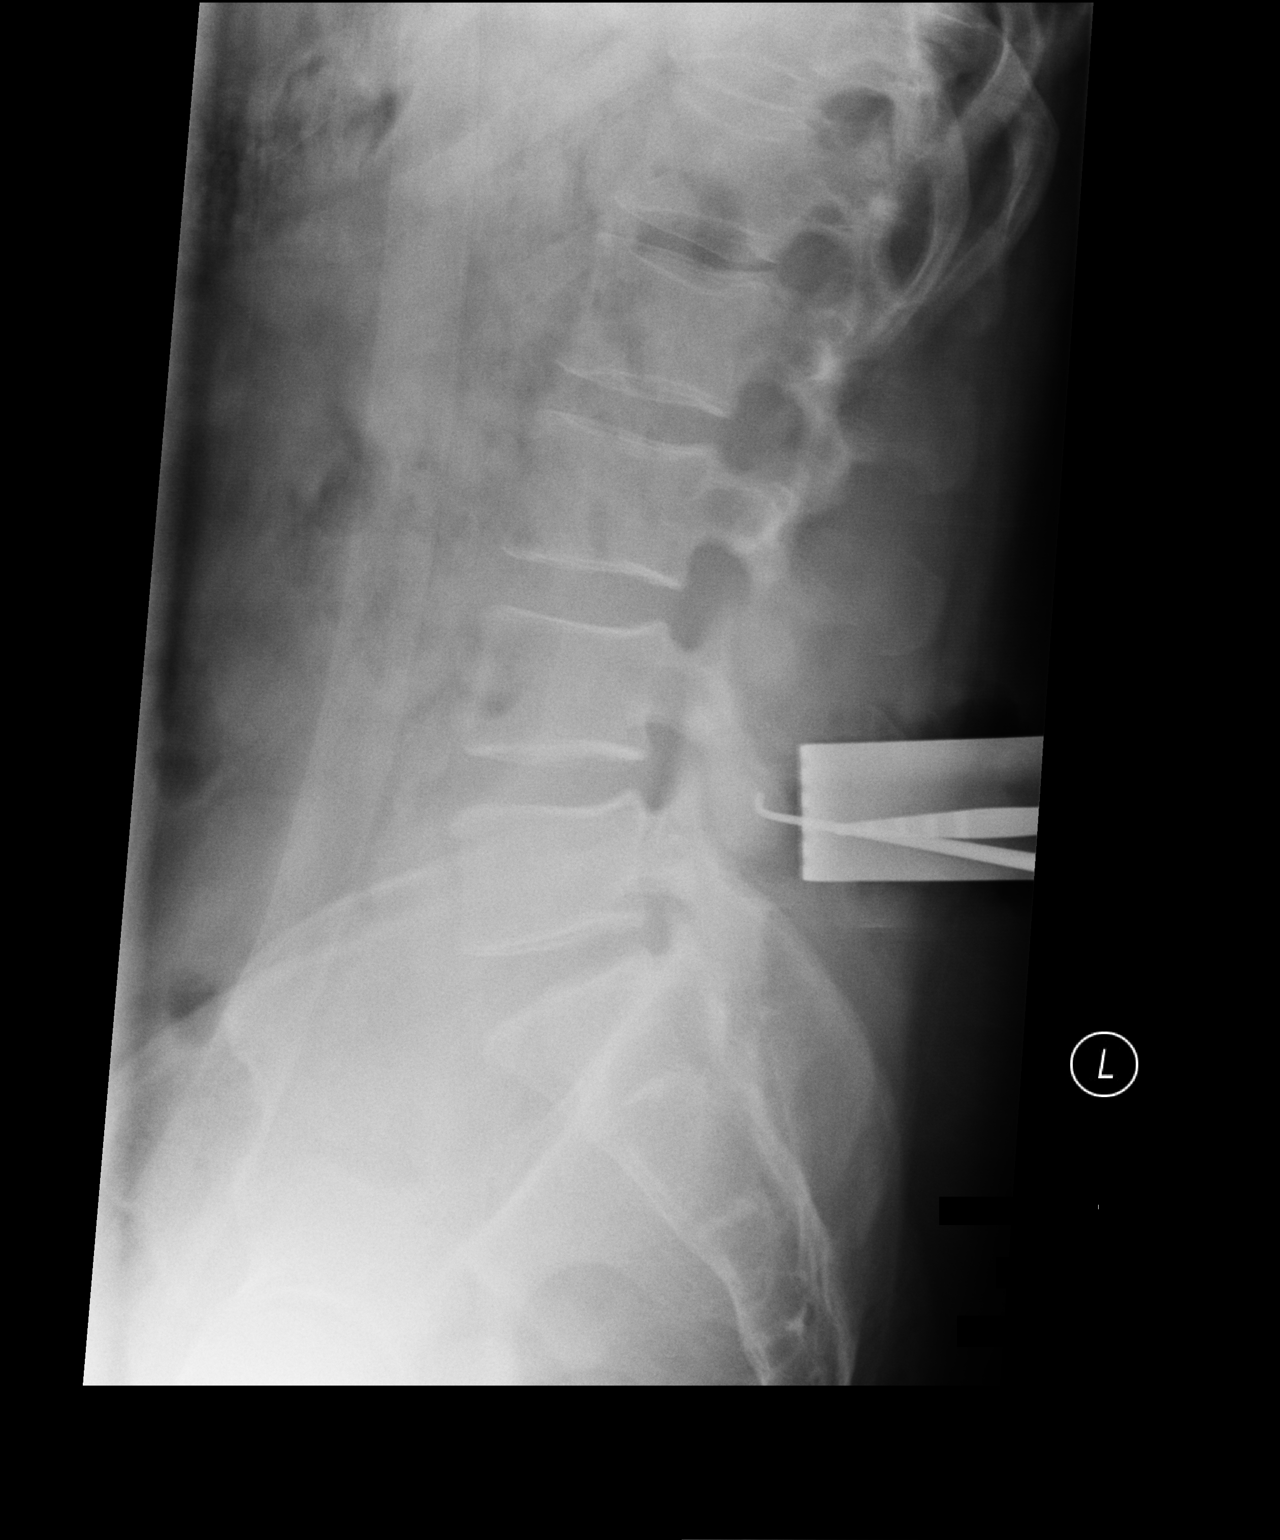

[2 of 2 positions shown; findings below may reference images not displayed]

FINDINGS: Initial film shows a needle at the pedicle level of L4. Second film
shows tissue spreaders posterior with a probe directed at the L4-5
disc space.
IMPRESSION: L4-5 localized.

L4-5 localized.
